# Patient Record
Sex: Male | Born: 1965 | Race: White | Hispanic: No | Marital: Married | Smoking: Never smoker
Health system: Southern US, Community
[De-identification: ages and names within clinical notes are randomized; demographics above are authoritative.]

## PROBLEM LIST (undated history)

## (undated) DIAGNOSIS — I10 Essential (primary) hypertension: Secondary | ICD-10-CM

## (undated) DIAGNOSIS — E119 Type 2 diabetes mellitus without complications: Secondary | ICD-10-CM

## (undated) DIAGNOSIS — E785 Hyperlipidemia, unspecified: Secondary | ICD-10-CM

## (undated) HISTORY — DX: Hyperlipidemia, unspecified: E78.5

## (undated) HISTORY — DX: Type 2 diabetes mellitus without complications: E11.9

## (undated) HISTORY — DX: Essential (primary) hypertension: I10

---

## 2004-05-03 ENCOUNTER — Emergency Department: Payer: Self-pay | Admitting: Emergency Medicine

## 2006-04-16 ENCOUNTER — Emergency Department: Payer: Self-pay | Admitting: Emergency Medicine

## 2006-04-16 ENCOUNTER — Emergency Department: Payer: Self-pay | Admitting: General Practice

## 2006-08-08 ENCOUNTER — Emergency Department: Payer: Self-pay | Admitting: Emergency Medicine

## 2006-11-25 ENCOUNTER — Ambulatory Visit: Payer: Self-pay | Admitting: Orthopaedic Surgery

## 2006-11-27 ENCOUNTER — Ambulatory Visit: Payer: Self-pay | Admitting: Unknown Physician Specialty

## 2009-05-01 ENCOUNTER — Emergency Department: Payer: Self-pay | Admitting: Unknown Physician Specialty

## 2009-05-02 ENCOUNTER — Emergency Department: Payer: Self-pay | Admitting: Emergency Medicine

## 2011-02-24 ENCOUNTER — Emergency Department: Payer: Self-pay | Admitting: Emergency Medicine

## 2012-01-26 LAB — BASIC METABOLIC PANEL
BUN: 13 mg/dL (ref 7–18)
Calcium, Total: 9.1 mg/dL (ref 8.5–10.1)
Chloride: 109 mmol/L — ABNORMAL HIGH (ref 98–107)
Co2: 26 mmol/L (ref 21–32)
EGFR (African American): >60
EGFR (Non-African Amer.): >60
Osmolality: 290 (ref 275–301)

## 2012-01-26 LAB — CBC
HCT: 45.4 % (ref 40.0–52.0)
HGB: 15.5 g/dL (ref 13.0–18.0)
MCH: 31.3 pg (ref 26.0–34.0)
MCHC: 34.2 g/dL (ref 32.0–36.0)
MCV: 91 fL (ref 80–100)
WBC: 9.3 10*3/uL (ref 3.8–10.6)

## 2012-01-27 ENCOUNTER — Observation Stay: Payer: Self-pay | Admitting: Internal Medicine

## 2012-01-27 LAB — CBC WITH DIFFERENTIAL/PLATELET
Basophil #: 0.1 10*3/uL (ref 0.0–0.1)
Lymphocyte #: 2.3 10*3/uL (ref 1.0–3.6)
Lymphocyte %: 28.7 %
MCHC: 34.5 g/dL (ref 32.0–36.0)
MCV: 92 fL (ref 80–100)
Monocyte %: 9.8 %
Neutrophil %: 58.6 %
Platelet: 223 10*3/uL (ref 150–440)
RBC: 4.36 10*6/uL — ABNORMAL LOW (ref 4.40–5.90)
RDW: 13.9 % (ref 11.5–14.5)
WBC: 8.2 10*3/uL (ref 3.8–10.6)

## 2012-01-27 LAB — BASIC METABOLIC PANEL
Anion Gap: 7 (ref 7–16)
Calcium, Total: 8.5 mg/dL (ref 8.5–10.1)
Co2: 29 mmol/L (ref 21–32)
EGFR (African American): >60
EGFR (Non-African Amer.): >60
Glucose: 87 mg/dL (ref 65–99)
Osmolality: 288 (ref 275–301)
Potassium: 4 mmol/L (ref 3.5–5.1)
Sodium: 145 mmol/L (ref 136–145)

## 2012-01-27 LAB — TROPONIN I
Troponin-I: <0.02 ng/mL
Troponin-I: <0.02 ng/mL

## 2012-01-27 LAB — LIPID PANEL: Ldl Cholesterol, Calc: 90 mg/dL (ref 0–100)

## 2012-01-27 LAB — CK-MB: CK-MB: 0.9 ng/mL (ref 0.5–3.6)

## 2012-01-28 ENCOUNTER — Ambulatory Visit: Payer: Self-pay | Admitting: Cardiology

## 2012-10-10 ENCOUNTER — Emergency Department: Payer: Self-pay | Admitting: Internal Medicine

## 2012-10-10 LAB — URINALYSIS, COMPLETE
Bilirubin,UR: NEGATIVE
Glucose,UR: NEGATIVE mg/dL (ref 0–75)
Ketone: NEGATIVE
Leukocyte Esterase: NEGATIVE
Nitrite: NEGATIVE
Protein: NEGATIVE
Specific Gravity: 1.017 (ref 1.003–1.030)
Squamous Epithelial: NONE SEEN
WBC UR: 4 /HPF (ref 0–5)

## 2012-10-10 LAB — CBC
MCH: 31.1 pg (ref 26.0–34.0)
MCHC: 34.9 g/dL (ref 32.0–36.0)
MCV: 89 fL (ref 80–100)
Platelet: 266 10*3/uL (ref 150–440)
RBC: 5.17 10*6/uL (ref 4.40–5.90)
RDW: 13.5 % (ref 11.5–14.5)

## 2012-10-10 LAB — COMPREHENSIVE METABOLIC PANEL
Alkaline Phosphatase: 100 U/L (ref 50–136)
Anion Gap: 6 — ABNORMAL LOW (ref 7–16)
BUN: 13 mg/dL (ref 7–18)
Bilirubin,Total: 0.8 mg/dL (ref 0.2–1.0)
Calcium, Total: 9.2 mg/dL (ref 8.5–10.1)
Chloride: 105 mmol/L (ref 98–107)
Creatinine: 1.52 mg/dL — ABNORMAL HIGH (ref 0.60–1.30)
EGFR (African American): >60
Potassium: 4 mmol/L (ref 3.5–5.1)
SGPT (ALT): 34 U/L (ref 12–78)
Sodium: 138 mmol/L (ref 136–145)

## 2012-10-11 ENCOUNTER — Emergency Department: Payer: Self-pay | Admitting: Emergency Medicine

## 2012-10-11 LAB — CBC
MCHC: 34.3 g/dL (ref 32.0–36.0)
MCV: 90 fL (ref 80–100)
Platelet: 236 10*3/uL (ref 150–440)
RBC: 4.92 10*6/uL (ref 4.40–5.90)

## 2012-10-11 LAB — URINALYSIS, COMPLETE
Bacteria: NONE SEEN
Ketone: NEGATIVE
Nitrite: NEGATIVE
Ph: 6 (ref 4.5–8.0)
RBC,UR: 1 /HPF (ref 0–5)
Specific Gravity: 1.013 (ref 1.003–1.030)
Squamous Epithelial: NONE SEEN

## 2012-10-11 LAB — COMPREHENSIVE METABOLIC PANEL
Alkaline Phosphatase: 89 U/L (ref 50–136)
BUN: 14 mg/dL (ref 7–18)
Chloride: 103 mmol/L (ref 98–107)
Co2: 28 mmol/L (ref 21–32)
Glucose: 105 mg/dL — ABNORMAL HIGH (ref 65–99)
Osmolality: 277 (ref 275–301)
Potassium: 4.4 mmol/L (ref 3.5–5.1)
SGOT(AST): 29 U/L (ref 15–37)
Sodium: 138 mmol/L (ref 136–145)

## 2012-10-13 ENCOUNTER — Ambulatory Visit: Payer: Self-pay | Admitting: Urology

## 2012-10-13 DIAGNOSIS — N200 Calculus of kidney: Secondary | ICD-10-CM | POA: Insufficient documentation

## 2014-08-15 NOTE — Consult Note (Signed)
    General Aspect patient is a [age] yol male with history of hypertension but no cardiac history he was admitted after suffering a syncopal episode.  the patient was in his usual state of health and was working when he developed midsternal chest pain with some diaphoresis. Subsequently while standing he suffered a syncopal episode. He was brought to the emergency room where he has ruled out for a myocardial infarction. EKG is normal. Laboratories were normal with the exception of possible mild volume depletion. He has had no arrhythmias. He has had no further chest pain. He has not had previous episodes similar to this in the past.   Physical Exam:   GEN well developed, well nourished, no acute distress    HEENT PERRL, hearing intact to voice    NECK supple    CARD Regular rate and rhythm  No murmur    ABD denies tenderness  normal BS    LYMPH negative neck, negative axillae    EXTR negative cyanosis/clubbing, negative edema    SKIN normal to palpation    NEURO cranial nerves intact, motor/sensory function intact    PSYCH A+O to time, place, person   Review of Systems:   Subjective/Chief Complaint chest pain and syncope    General: No Complaints    Skin: No Complaints    ENT: No Complaints    Eyes: No Complaints    Neck: No Complaints    Respiratory: No Complaints    Cardiovascular: Chest pain or discomfort  Dyspnea    Gastrointestinal: No Complaints    Genitourinary: No Complaints    Vascular: No Complaints    Musculoskeletal: No Complaints    Neurologic: Fainting    Hematologic: No Complaints    Endocrine: No Complaints    Psychiatric: No Complaints    Review of Systems: All other systems were reviewed and found to be negative    Medications/Allergies Reviewed Medications/Allergies reviewed     Hypertension:    Gout:    Kidney Stones:    Denies surgical history.:   EKG:   EKG Nml  NSR    PCN: Rash    Impression 49 year old male with  history of no prior cardiac problems with hypertension who was admitted after developing an episode of chest pain while at work complicated by lightheadedness and syncope. He is ruled out for a myocardial infarction. He has had no arrhythmias. EKG is unremarkable. Etiology of the vent appeared to have been a vagal episode with regard to his syncope the had an ischemic event as his EKG and labs are unremarkable. He appears hemodynamically stable at present. Would recommend ambulating today and if stable discharge with outpatient followup.    Plan 1. Continue with current medications 2. Low-fat, low-cholesterol, low-sodium diet 3. Hydration 4. Continue current outpatient meds 4. Would ambulate and if stable discharge with outpatient followup.   Electronic Signatures: Dalia HeadingFath, Buena Boehm A (MD)  (Signed 30-Oct-13 08:54)  Authored: General Aspect/Present Illness, History and Physical Exam, Review of System, Past Medical History, EKG , Allergies, Impression/Plan   Last Updated: 30-Oct-13 08:54 by Dalia HeadingFath, Lamari Youngers A (MD)

## 2014-08-15 NOTE — Consult Note (Signed)
Brief Consult Note: Diagnosis: chest pain with typical and atypical features. Ruled out for an mi.   Patient was seen by consultant.   Recommend further assessment or treatment.   Discussed with Attending MD.   Comments: 49 yo with history of chest pain and syncope. Ruled out for an mi. EKG normal. Echo revealed no siginificant abnormalities. Would ambulate and discharge to home. Scheduled for an outpatient myoview in am. [facility] NPO after midnight.  Electronic Signatures: Dalia HeadingFath, Chandi Nicklin A (MD)  (Signed 01-Oct-13 14:41)  Authored: Brief Consult Note   Last Updated: 01-Oct-13 14:41 by Dalia HeadingFath, Yolanda Dockendorf A (MD)

## 2014-08-15 NOTE — Discharge Summary (Signed)
PATIENT NAME:  Clifford Wong, Clifford Wong MR#:  [id number] DATE OF BIRTH:  08/03/1965  DATE OF ADMISSION:  01/27/2012 DATE OF DISCHARGE:  01/27/2012  ADMITTING PHYSICIAN: Clifford Bienenstockawood Elgergawy, MD     DISCHARGING PHYSICIAN: Clifford Baasadhika Maysen Bonsignore, MD   PRIMARY CARE PHYSICIAN: Clifford LeavensJames F. Burnett ShengHedrick, MD    HOSPITAL CONSULTATIONS:  Cardiology consultation by Dr. Lady Wong.   DISCHARGE DIAGNOSES:  1. Syncope, likely cardiogenic versus vasovagal.  2. Hypertension.  3. Gout.  DISCHARGE MEDICATIONS:  1. Aspirin 81 mg p.o. daily.  2. Lisinopril 10 mg p.o. daily.  3. Allopurinol 300 mg p.o. daily.   DISCHARGE DIET: Low sodium diet.   DISCHARGE ACTIVITY: As tolerated.    FOLLOW-UP INSTRUCTIONS:  1. Myoview scheduled for tomorrow morning to be done by Dr. Lady Wong, and the patient was advised to arrive at the registration by 8:15 a.m. He was also advised to take nothing by mouth after midnight tonight.  2. Follow up with Dr. Lady Wong as recommended after Myoview.  3. Follow up with PCP in one week.   LABORATORY, DIAGNOSTIC AND RADIOLOGICAL DATA: WBC 8.2, hemoglobin 13.9, hematocrit 40.2, platelet count 223. Sodium 145, potassium 4.0, chloride 109, bicarbonate 29, BUN 13, creatinine 1.2, glucose of 87, calcium 8.5. LDL 90, HDL 32, triglycerides 161, cholesterol 154. Troponin is less than 0.02. CT of the head showing no acute intracranial process, and chest x-ray on admission showing no acute cardiopulmonary abnormality. Carotid Dopplers showing normal without any significant stenosis, and CT of the chest was negative for any PE or any lung pathology.   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 The patient said he did not feel well after he woke up yesterday morning, has been having some sharp chest pains followed by heavy pains as if like a heavy gorilla was sitting on his chest. He went  to the bathroom, and the next thing he remembers was he was waking up on the floor. He felt lightheaded before he passed out and might have passed out for only five minutes. Then EMS brought him to the hospital. He was dehydrated with orthostatic tachycardia, elevated heart rate on standing up, but otherwise no arrhythmias or other abnormalities seen.   Syncope: Not sure if it is cardiogenic or vasovagal. He was given IV fluids, seen by cardiologist, Dr. Lady Wong. He had an echocardiogram done. Official report is pending, but per Dr. Lady Wong the ejection fraction looks fine, and he did not have any significant valvular abnormalities. The patient would benefit from getting a Myoview done. Since he wanted to go home, he is being discharged and Myoview set up for tomorrow morning. His troponins in the hospital have remained negative. He denies any chest pain now and is ambulating well without any trouble. His other home medications, lisinopril and allopurinol, were continued without any changes. He is advised to take an aspirin if he is not allergic to that or does not have significant gastrointestinal distress with that.   DISCHARGE CONDITION: Stable.   DISCHARGE DISPOSITION: Home.   TIME SPENT ON DISCHARGE: 45 minutes.  ____________________________ Clifford Baasadhika Fayetta Sorenson, MD rk:cbb D: 01/27/2012 14:56:00 ET T: 01/28/2012 11:03:54 ET JOB#: [id number]  cc: Clifford Baasadhika Coleson Kant, MD, <Dictator> Clifford LeavensJames F. Burnett ShengHedrick, MD Darlin PriestlyKenneth A. Clifford GaryFath, MD Clifford BaasADHIKA Deakin Lacek MD ELECTRONICALLY SIGNED 01/29/2012 14:58

## 2014-08-15 NOTE — H&P (Signed)
PATIENT NAME:  Clifford Wong, Clifford Wong MR#:  [id number] DATE OF BIRTH:  [birth year]  DATE OF ADMISSION:  01/27/2012  PRIMARY CARE PHYSICIAN: Dr. Jerl Mina  REFERRING PHYSICIAN: Dr. Dorothea Glassman    CHIEF COMPLAINT: Chest pain and syncope.   HISTORY OF PRESENT ILLNESS: This is a 49 year old male with significant past medical history of gout and hypertension, presents with complaint of chest pain. Patient reports initially had chest pain while at work around 7:30 p.m. Pain was sharp, piercing quality in left mid sternal area, he reports lasted for two minutes, was accompanied by shortness of breath and diaphoresis, nausea, then patient reports he went to restroom where last thing he remembers was washing his hands, then he reports remembering waking up on the floor, he reports the episode lasted for around five minutes as he called his symptoms to one of his partners on the force and they came and found him after five minutes laying on the floor. There are no urinary or stool incontinence. Patient had some mild forehead trauma where he had CT brain which did not show any acute finding. Currently patient denies any chest pain, denies any previous history of heart disease or similar complaints. Patient reports feeling lightheaded prior to this presentation. Patient was orthostatic as his heart rate went from 75 supine to 104 upon standing but blood pressure remained stable. Patient reports good intake of fluids.   PAST MEDICAL HISTORY:  1. Gout.  2. Hypertension.   PAST SURGICAL HISTORY: Vasectomy.   ALLERGIES: History of allergy to penicillin.   HOME MEDICATIONS:  1. Lisinopril 10 mg oral daily.  2. Allopurinol 300 mg oral daily.   FAMILY HISTORY: Denies any family history of coronary artery disease or diabetes.   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 Denies any smoking, alcohol, or illicit drug abuse.   REVIEW OF SYSTEMS: CONSTITUTIONAL: Patient denies any fever,  fatigue, weakness. EYES: Denies blurry vision, double vision or pain. ENT: Denies tinnitus, ear pain, hearing loss. RESPIRATORY: Denies cough, wheezing, hemoptysis. Has episode of dyspnea. CARDIOVASCULAR: Had episode of chest pain. Denies any edema, arrhythmia, palpitations. Had syncope. GASTROINTESTINAL: Complains of nausea. Denies any diarrhea, abdominal pain, hematemesis, jaundice. GENITOURINARY: Denies dysuria, hematuria, renal colic. ENDO: Denies polyuria, nocturia, heat or cold intolerance. HEMATOLOGY: Denies anemia, easy bruising, bleeding diathesis. MUSCULOSKELETAL: Denies neck pain, shoulder pain, knee pain, arthritis, cramps or swelling. Has history of gout. NEUROLOGICAL: Denies numbness, dysarthria, epilepsy, tremors, vertigo, ataxia. Had lightheadedness. PSYCHIATRIC: Denies anxiety, insomnia, schizophrenia, nervousness, bipolar disorder.   PHYSICAL EXAMINATION:  VITAL SIGNS: Temperature 98.4, pulse 75, respiratory rate 20, blood pressure 131/67, saturating 97% on room air, pulse upon laying supine 74, pulse upon standing 104.   GENERAL: Well-nourished young male, looks comfortable in bed in no apparent distress.   HEENT: Head atraumatic, normocephalic. Pupils equal, reactive to light. Pink conjunctivae. Anicteric sclerae. Moist oral mucosa.   NECK: Supple. No thyromegaly. No JVD.   CHEST: Good air entry bilaterally. No wheezing, rales, rhonchi.   CARDIOVASCULAR: S1, S2 heard. No rubs, murmur, gallops. No carotid bruits.  ABDOMEN: Soft, nontender, nondistended. Bowel sounds present.   EXTREMITIES: No edema, no clubbing, no cyanosis.    NEUROLOGICAL: Cranial nerves grossly intact. Motor 5/5. Sensation symmetrical bilaterally.   PSYCHIATRIC: Appropriate affect. Awake, alert x3. Intact judgment and insight.    LABORATORY, DIAGNOSTIC AND RADIOLOGICAL DATA:  Glucose 113, BUN 13, creatinine 1.18.  Sodium 145, potassium 3.8, chloride 109, CO2 26, anion gap 10. Troponin less  than 0.02.  White blood cells 9.3, hemoglobin 15.5, hematocrit 45.4, platelets 270.   EKG showing sinus tachycardia with ventricular rate of 111. No significant ST or T wave changes.   ASSESSMENT AND PLAN:  1. Chest pain, currently resolved. Patient had dyspnea, nausea, diaphoresis with the chest pain. First set of cardiac enzymes is negative. No significant EKG changes. Given 324 of aspirin, will continue on aspirin. Will check lipid panel. Will check 2-D echo and will consult cardiology service in a.m. for further management of chest pain.  2. Syncope. Patient was mildly tachycardic upon standing. Will continue with fluid hydration, received 1 liter bolus in ED, will continue 100 mL/h of normal saline. Will check bilateral carotid Doppler and MRI of the brain. Will admit to telemetry and will check 2-D echo.  3. Hypertension. Given the fact patient was tachycardic upon standing will hold his lisinopril, continue with fluids and will resume when patient is more stable.  4. Gout. Will continue with allopurinol. Currently has no complaints.  5. Deep vein thrombosis prophylaxis. Sub-Q heparin.  6. CODE STATUS: FULL CODE.   TOTAL TIME SPENT ON ADMISSION AND PATIENT CARE: 55 minutes.   ____________________________ Starleen Armsawood S. Pocahontas Cohenour, MD dse:cms D: 01/27/2012 01:04:44 ET T: 01/27/2012 08:04:16 ET JOB#: [id number]  cc: Starleen Armsawood S. Jaymarie Yeakel, MD, <Dictator> Rhona LeavensJames F. Burnett ShengHedrick, MD Paschal Blanton Teena IraniS Dez Stauffer MD ELECTRONICALLY SIGNED 01/28/2012 1:46

## 2016-11-13 ENCOUNTER — Encounter (INDEPENDENT_AMBULATORY_CARE_PROVIDER_SITE_OTHER): Payer: Self-pay

## 2016-11-13 ENCOUNTER — Ambulatory Visit: Payer: Self-pay | Admitting: Physician Assistant

## 2016-11-13 VITALS — BP 129/80 | HR 93 | Temp 98.5°F | Resp 16

## 2016-11-13 DIAGNOSIS — R631 Polydipsia: Secondary | ICD-10-CM

## 2016-11-13 DIAGNOSIS — R358 Other polyuria: Secondary | ICD-10-CM

## 2016-11-13 DIAGNOSIS — M109 Gout, unspecified: Secondary | ICD-10-CM | POA: Insufficient documentation

## 2016-11-13 DIAGNOSIS — R5383 Other fatigue: Secondary | ICD-10-CM

## 2016-11-13 DIAGNOSIS — R3589 Other polyuria: Secondary | ICD-10-CM

## 2016-11-13 NOTE — Progress Notes (Addendum)
S: pt states he is concerned that he has diabetes.  States he has been really thirsty and having to urinate a lot.  Goes at least 10-12 times a day, is really tired, no cp/sob, no v/d, no weight gain or weight loss, no fam hx of diabetes  O: vitals wnl, nad, tms clear, throat wnl, neck supple no lymph, lungs c t a, cv rrr, ekg wnl, ua 2+ glucose  A: polydipsia, polyuria, most likely diabetes, fatigue  P: labs today, will f/u with pt when receive results Lab results show a1c 11.0, triglycerides 868, creatinine of 1.3,  And vit d of 12. rma deacon called pt to discuss, will send glucometer to pt , sent in rx for metformin, vit d, and tricor, lancets, test strips, pt to be referred to endocrinolgy, advise he at least take the prediabetes class for $35 to help with diet, can refer to lifestyle for full diabetes class if he prefers

## 2016-11-14 ENCOUNTER — Encounter: Payer: Self-pay | Admitting: Physician Assistant

## 2016-11-14 LAB — CMP12+LP+TP+TSH+6AC+PSA+CBC…
ALK PHOS: 99 IU/L (ref 39–117)
ALT: 56 IU/L — ABNORMAL HIGH (ref 0–44)
AST: 33 IU/L (ref 0–40)
Albumin/Globulin Ratio: 2.4 — ABNORMAL HIGH (ref 1.2–2.2)
Albumin: 4.7 g/dL (ref 3.5–5.5)
BASOS ABS: 0.1 10*3/uL (ref 0.0–0.2)
BILIRUBIN TOTAL: 0.3 mg/dL (ref 0.0–1.2)
BUN / CREAT RATIO: 15 (ref 9–20)
BUN: 20 mg/dL (ref 6–24)
Basos: 1 %
CHLORIDE: 94 mmol/L — AB (ref 96–106)
CHOL/HDL RATIO: 8.4 ratio — AB (ref 0.0–5.0)
CHOLESTEROL TOTAL: 243 mg/dL — AB (ref 100–199)
Calcium: 9.6 mg/dL (ref 8.7–10.2)
Creatinine, Ser: 1.3 mg/dL — ABNORMAL HIGH (ref 0.76–1.27)
EOS (ABSOLUTE): 0.3 10*3/uL (ref 0.0–0.4)
EOS: 3 %
ESTIMATED CHD RISK: 1.7 times avg. — AB (ref 0.0–1.0)
FREE THYROXINE INDEX: 2.4 (ref 1.2–4.9)
GFR calc non Af Amer: 63 mL/min/{1.73_m2} (ref >59)
GFR, EST AFRICAN AMERICAN: 73 mL/min/{1.73_m2} (ref >59)
GGT: 31 IU/L (ref 0–65)
GLUCOSE: 372 mg/dL — AB (ref 65–99)
Globulin, Total: 2 g/dL (ref 1.5–4.5)
HDL: 29 mg/dL — ABNORMAL LOW (ref >39)
HEMOGLOBIN: 15.7 g/dL (ref 13.0–17.7)
Hematocrit: 46.5 % (ref 37.5–51.0)
IMMATURE GRANS (ABS): 0 10*3/uL (ref 0.0–0.1)
IMMATURE GRANULOCYTES: 0 %
Iron: 82 ug/dL (ref 38–169)
LDH: 205 IU/L (ref 121–224)
Lymphocytes Absolute: 2.1 10*3/uL (ref 0.7–3.1)
Lymphs: 29 %
MCH: 30.8 pg (ref 26.6–33.0)
MCHC: 33.8 g/dL (ref 31.5–35.7)
MCV: 91 fL (ref 79–97)
MONOCYTES: 7 %
Monocytes Absolute: 0.5 10*3/uL (ref 0.1–0.9)
NEUTROS PCT: 60 %
Neutrophils Absolute: 4.4 10*3/uL (ref 1.4–7.0)
PLATELETS: 256 10*3/uL (ref 150–379)
PROSTATE SPECIFIC AG, SERUM: 0.3 ng/mL (ref 0.0–4.0)
Phosphorus: 4.9 mg/dL — ABNORMAL HIGH (ref 2.5–4.5)
Potassium: 4.4 mmol/L (ref 3.5–5.2)
RBC: 5.09 x10E6/uL (ref 4.14–5.80)
RDW: 13.9 % (ref 12.3–15.4)
Sodium: 136 mmol/L (ref 134–144)
T3 Uptake Ratio: 29 % (ref 24–39)
T4, Total: 8.2 ug/dL (ref 4.5–12.0)
TSH: 2.04 u[IU]/mL (ref 0.450–4.500)
Total Protein: 6.7 g/dL (ref 6.0–8.5)
Triglycerides: 868 mg/dL (ref 0–149)
Uric Acid: 5.3 mg/dL (ref 3.7–8.6)
WBC: 7.3 10*3/uL (ref 3.4–10.8)

## 2016-11-14 LAB — POCT URINALYSIS DIPSTICK
Bilirubin, UA: NEGATIVE
Blood, UA: NEGATIVE
Ketones, UA: NEGATIVE
LEUKOCYTES UA: NEGATIVE
Nitrite, UA: NEGATIVE
PROTEIN UA: NEGATIVE
SPEC GRAV UA: 1.01 (ref 1.010–1.025)
UROBILINOGEN UA: 0.2 U/dL
pH, UA: 6 (ref 5.0–8.0)

## 2016-11-14 LAB — B12 AND FOLATE PANEL
Folate: 16.9 ng/mL (ref >3.0)
Vitamin B-12: 474 pg/mL (ref 232–1245)

## 2016-11-14 LAB — HGB A1C W/O EAG: Hgb A1c MFr Bld: 11.6 % — ABNORMAL HIGH (ref 4.8–5.6)

## 2016-11-14 LAB — VITAMIN D 25 HYDROXY (VIT D DEFICIENCY, FRACTURES): Vit D, 25-Hydroxy: 12 ng/mL — ABNORMAL LOW (ref 30.0–100.0)

## 2016-11-14 MED ORDER — GLUCOSE BLOOD VI STRP: ORAL_STRIP | 12 refills | Status: DC

## 2016-11-14 MED ORDER — METFORMIN HCL 1000 MG PO TABS: 1000.0000 mg | ORAL_TABLET | Freq: Two times a day (BID) | ORAL | 3 refills | Status: DC

## 2016-11-14 MED ORDER — ONETOUCH ULTRASOFT LANCETS MISC: 12 refills | Status: DC

## 2016-11-14 MED ORDER — VITAMIN D (ERGOCALCIFEROL) 1.25 MG (50000 UNIT) PO CAPS: 50000.0000 [IU] | ORAL_CAPSULE | ORAL | 3 refills | Status: DC

## 2016-11-14 MED ORDER — FENOFIBRATE 160 MG PO TABS: 160.0000 mg | ORAL_TABLET | Freq: Every day | ORAL | 6 refills | Status: DC

## 2016-11-14 MED ORDER — METFORMIN HCL 500 MG PO TABS: 500.0000 mg | ORAL_TABLET | Freq: Two times a day (BID) | ORAL | 0 refills | Status: DC

## 2016-11-14 NOTE — Addendum Note (Signed)
Addended by: Catha BrowEACON, Tamsin Nader T on: 11/14/2016 10:43 AM   Modules accepted: Orders

## 2016-11-14 NOTE — Addendum Note (Signed)
Addended by: Faythe GheeFISHER, SUSAN W on: 11/14/2016 04:18 PM   Modules accepted: Orders

## 2016-11-16 LAB — MICROALBUMIN / CREATININE URINE RATIO
CREATININE, UR: 45.6 mg/dL
Microalb/Creat Ratio: <6.6 mg/g creat (ref 0.0–30.0)
Microalbumin, Urine: <3 ug/mL

## 2016-11-17 NOTE — Progress Notes (Signed)
[facility].

## 2016-11-17 NOTE — Telephone Encounter (Signed)
Please see message attached

## 2016-11-19 ENCOUNTER — Encounter: Payer: Self-pay | Admitting: Physician Assistant

## 2016-11-20 NOTE — Telephone Encounter (Signed)
See message attached

## 2017-01-23 ENCOUNTER — Other Ambulatory Visit: Payer: Self-pay | Admitting: Physician Assistant

## 2017-01-23 NOTE — Telephone Encounter (Signed)
Med refill for lisinopril approved 

## 2017-02-14 ENCOUNTER — Encounter: Payer: Self-pay | Admitting: Physician Assistant

## 2017-02-20 ENCOUNTER — Ambulatory Visit: Payer: Self-pay | Admitting: Physician Assistant

## 2017-02-20 DIAGNOSIS — Z299 Encounter for prophylactic measures, unspecified: Secondary | ICD-10-CM

## 2017-02-20 NOTE — Progress Notes (Signed)
Patient came in to have blood drawn for testing for an upcoming appointment with Dr. Tedd [facility]iasSolum at [facility].

## 2017-02-21 LAB — CMP12+LP+TP+TSH+6AC+CBC/D/PLT
ALT: 29 IU/L (ref 0–44)
AST: 25 IU/L (ref 0–40)
Albumin/Globulin Ratio: 2.5 — ABNORMAL HIGH (ref 1.2–2.2)
Albumin: 5 g/dL (ref 3.5–5.5)
Alkaline Phosphatase: 49 IU/L (ref 39–117)
BASOS ABS: 0.1 10*3/uL (ref 0.0–0.2)
BUN / CREAT RATIO: 18 (ref 9–20)
BUN: 22 mg/dL (ref 6–24)
Basos: 1 %
Bilirubin Total: 0.4 mg/dL (ref 0.0–1.2)
CALCIUM: 9.7 mg/dL (ref 8.7–10.2)
CHOL/HDL RATIO: 3.9 ratio (ref 0.0–5.0)
CREATININE: 1.25 mg/dL (ref 0.76–1.27)
Chloride: 105 mmol/L (ref 96–106)
Cholesterol, Total: 155 mg/dL (ref 100–199)
EOS (ABSOLUTE): 0.4 10*3/uL (ref 0.0–0.4)
ESTIMATED CHD RISK: 0.7 times avg. (ref 0.0–1.0)
Eos: 7 %
Free Thyroxine Index: 2.1 (ref 1.2–4.9)
GFR calc Af Amer: 77 mL/min/{1.73_m2} (ref >59)
GFR, EST NON AFRICAN AMERICAN: 66 mL/min/{1.73_m2} (ref >59)
GGT: 16 IU/L (ref 0–65)
GLOBULIN, TOTAL: 2 g/dL (ref 1.5–4.5)
GLUCOSE: 102 mg/dL — AB (ref 65–99)
HDL: 40 mg/dL (ref >39)
Hematocrit: 45.7 % (ref 37.5–51.0)
Hemoglobin: 15.3 g/dL (ref 13.0–17.7)
Immature Grans (Abs): 0 10*3/uL (ref 0.0–0.1)
Immature Granulocytes: 0 %
Iron: 101 ug/dL (ref 38–169)
LDH: 150 IU/L (ref 121–224)
LDL Calculated: 85 mg/dL (ref 0–99)
LYMPHS ABS: 1.9 10*3/uL (ref 0.7–3.1)
Lymphs: 33 %
MCH: 30.8 pg (ref 26.6–33.0)
MCHC: 33.5 g/dL (ref 31.5–35.7)
MCV: 92 fL (ref 79–97)
Monocytes Absolute: 0.5 10*3/uL (ref 0.1–0.9)
Monocytes: 8 %
NEUTROS ABS: 3 10*3/uL (ref 1.4–7.0)
Neutrophils: 51 %
PHOSPHORUS: 3.2 mg/dL (ref 2.5–4.5)
Platelets: 314 10*3/uL (ref 150–379)
Potassium: 5.2 mmol/L (ref 3.5–5.2)
RBC: 4.97 x10E6/uL (ref 4.14–5.80)
RDW: 14.5 % (ref 12.3–15.4)
Sodium: 142 mmol/L (ref 134–144)
T3 Uptake Ratio: 24 % (ref 24–39)
T4 TOTAL: 8.7 ug/dL (ref 4.5–12.0)
TSH: 1.88 u[IU]/mL (ref 0.450–4.500)
Total Protein: 7 g/dL (ref 6.0–8.5)
Triglycerides: 148 mg/dL (ref 0–149)
URIC ACID: 7.3 mg/dL (ref 3.7–8.6)
VLDL Cholesterol Cal: 30 mg/dL (ref 5–40)
WBC: 5.7 10*3/uL (ref 3.4–10.8)

## 2017-02-21 LAB — HGB A1C W/O EAG: HEMOGLOBIN A1C: 5.8 % — AB (ref 4.8–5.6)

## 2017-06-03 ENCOUNTER — Ambulatory Visit: Payer: Self-pay | Admitting: Emergency Medicine

## 2017-06-03 DIAGNOSIS — Z299 Encounter for prophylactic measures, unspecified: Secondary | ICD-10-CM

## 2017-06-03 NOTE — Progress Notes (Unsigned)
Patient came in to have blood drawn for testing per Dr. Solum's orders. 

## 2017-06-04 LAB — HGB A1C W/O EAG: HEMOGLOBIN A1C: 5.5 % (ref 4.8–5.6)

## 2017-06-04 LAB — COMPREHENSIVE METABOLIC PANEL
A/G RATIO: 2.3 — AB (ref 1.2–2.2)
ALBUMIN: 4.8 g/dL (ref 3.5–5.5)
ALK PHOS: 75 IU/L (ref 39–117)
ALT: 16 IU/L (ref 0–44)
AST: 19 IU/L (ref 0–40)
BILIRUBIN TOTAL: 0.6 mg/dL (ref 0.0–1.2)
BUN / CREAT RATIO: 10 (ref 9–20)
BUN: 12 mg/dL (ref 6–24)
CHLORIDE: 104 mmol/L (ref 96–106)
CO2: 25 mmol/L (ref 20–29)
Calcium: 9.8 mg/dL (ref 8.7–10.2)
Creatinine, Ser: 1.15 mg/dL (ref 0.76–1.27)
GFR calc non Af Amer: 73 mL/min/{1.73_m2} (ref >59)
GFR, EST AFRICAN AMERICAN: 84 mL/min/{1.73_m2} (ref >59)
Globulin, Total: 2.1 g/dL (ref 1.5–4.5)
Glucose: 113 mg/dL — ABNORMAL HIGH (ref 65–99)
POTASSIUM: 4.8 mmol/L (ref 3.5–5.2)
Sodium: 144 mmol/L (ref 134–144)
TOTAL PROTEIN: 6.9 g/dL (ref 6.0–8.5)

## 2017-10-19 ENCOUNTER — Telehealth: Payer: Self-pay

## 2017-10-19 NOTE — Telephone Encounter (Signed)
Monitoring/refills are the responsibility of the patient's primary care provider.  Please notify the pharmacy.

## 2017-10-19 NOTE — Telephone Encounter (Signed)
Pharmacy notified.

## 2017-10-19 NOTE — Telephone Encounter (Signed)
Received fax requested refill for Vitamin D.  Last Vit D lab 11/13/2016. Last O/V with provider 11/13/2016  Other "O/V" were lab appointments for other Dr. orders.

## 2017-12-11 ENCOUNTER — Other Ambulatory Visit: Payer: Self-pay

## 2017-12-11 DIAGNOSIS — Z794 Long term (current) use of insulin: Principal | ICD-10-CM

## 2017-12-11 DIAGNOSIS — E119 Type 2 diabetes mellitus without complications: Secondary | ICD-10-CM

## 2017-12-12 LAB — LIPID PANEL WITH LDL/HDL RATIO
Cholesterol, Total: 143 mg/dL (ref 100–199)
HDL: 38 mg/dL — AB (ref >39)
LDL CALC: 85 mg/dL (ref 0–99)
LDL/HDL RATIO: 2.2 ratio (ref 0.0–3.6)
TRIGLYCERIDES: 98 mg/dL (ref 0–149)
VLDL Cholesterol Cal: 20 mg/dL (ref 5–40)

## 2017-12-12 LAB — MICROALBUMIN / CREATININE URINE RATIO
Creatinine, Urine: 82.9 mg/dL
Microalb/Creat Ratio: <3.6 mg/g creat (ref 0.0–30.0)
Microalbumin, Urine: <3 ug/mL

## 2017-12-12 LAB — BASIC METABOLIC PANEL
BUN / CREAT RATIO: 17 (ref 9–20)
BUN: 22 mg/dL (ref 6–24)
CHLORIDE: 104 mmol/L (ref 96–106)
CO2: 23 mmol/L (ref 20–29)
CREATININE: 1.3 mg/dL — AB (ref 0.76–1.27)
Calcium: 9.7 mg/dL (ref 8.7–10.2)
GFR calc Af Amer: 73 mL/min/{1.73_m2} (ref >59)
GFR calc non Af Amer: 63 mL/min/{1.73_m2} (ref >59)
GLUCOSE: 100 mg/dL — AB (ref 65–99)
POTASSIUM: 5.6 mmol/L — AB (ref 3.5–5.2)
SODIUM: 142 mmol/L (ref 134–144)

## 2017-12-12 LAB — HGB A1C W/O EAG: HEMOGLOBIN A1C: 5.6 % (ref 4.8–5.6)

## 2018-07-05 ENCOUNTER — Telehealth: Payer: Self-pay

## 2018-07-05 NOTE — Telephone Encounter (Signed)
Patient called the clinic wanting to know if we were able to refill his potassium citrate prescription. He was informed that we could not at the Acute care level refill that medication, that it would need to be done through a PCP. The patient does not currently have a PCP and was informed that the clinic has a list on hand that he could come by to pick up at his convenience  The patient gave verbal understanding.

## 2018-08-18 ENCOUNTER — Encounter: Payer: Self-pay | Admitting: Family Medicine

## 2018-08-18 ENCOUNTER — Telehealth: Payer: Self-pay

## 2018-08-18 ENCOUNTER — Ambulatory Visit (INDEPENDENT_AMBULATORY_CARE_PROVIDER_SITE_OTHER): Payer: Self-pay | Admitting: Family Medicine

## 2018-08-18 ENCOUNTER — Ambulatory Visit: Payer: Self-pay | Admitting: Family Medicine

## 2018-08-18 DIAGNOSIS — N200 Calculus of kidney: Secondary | ICD-10-CM

## 2018-08-18 DIAGNOSIS — E119 Type 2 diabetes mellitus without complications: Secondary | ICD-10-CM

## 2018-08-18 DIAGNOSIS — M1A9XX0 Chronic gout, unspecified, without tophus (tophi): Secondary | ICD-10-CM

## 2018-08-18 DIAGNOSIS — Z13220 Encounter for screening for lipoid disorders: Secondary | ICD-10-CM

## 2018-08-18 MED ORDER — ALLOPURINOL 300 MG PO TABS: 300.0000 mg | ORAL_TABLET | Freq: Every day | ORAL | 1 refills | Status: DC

## 2018-08-18 MED ORDER — POTASSIUM CITRATE ER 15 MEQ (1620 MG) PO TBCR: 1.0000 | EXTENDED_RELEASE_TABLET | Freq: Every day | ORAL | 1 refills | Status: DC

## 2018-08-18 NOTE — Telephone Encounter (Signed)
Patient called the clinic needing a refill on his allopurinol and his Potassium but has not had recent labs or obtained a PCP. I was able to get him an appointment with a local PCP for today and they will be contacting him for his visit and medication maintenance. The patient gave verbal understanding.

## 2018-08-18 NOTE — Progress Notes (Signed)
Patient ID: Clifford CunasJeffrey H Crossman, male   DOB: 11/13/1965, 53 y.o.   MRN: [id number]  Virtual Visit via video Note  This visit type was conducted due to national recommendations for restrictions regarding the COVID-19 pandemic (e.g. social distancing).  This format is felt to be most appropriate for this patient at this time.  All issues noted in this document were discussed and addressed.  No physical exam was performed (except for noted visual exam findings with Video Visits).   I connected with Gladis RiffleJeffrey Babington on 08/18/18 at 11:20 AM EDT by a video enabled telemedicine application or telephone and verified that I am speaking with the correct person using two identifiers. Location patient: home Location provider: [facility]  Persons participating in the virtual visit: patient, provider  I discussed the limitations, risks, security and privacy concerns of performing an evaluation and management service by video and the availability of in person appointments. I also discussed with the patient that there may be a patient responsible charge related to this service. The patient expressed understanding and agreed to proceed.  HPI: Patient and I connected via video for him to establish with PCP.  [facility], but was told he must have a PCP for further refills of medications.  He has a history of gout and kidney stones for which he takes allopurinol and potassium supplement.  He has not had lab work since August 2019.  He also previously had been on metformin to treat type 2 diabetes, lisinopril for high blood pressure and fenofibrate for cholesterol.  He no longer takes any of those medications.   Review of Systems  Constitutional: Negative for chills, fatigue and fever.  HENT: Negative for congestion, ear pain, sinus pain and sore throat.   Eyes: Negative.   Respiratory: Negative for cough, shortness of breath and wheezing.   Cardiovascular: Negative for  chest pain, palpitations and leg swelling.  Gastrointestinal: Negative for abdominal pain, diarrhea, nausea and vomiting.  Genitourinary: Negative for dysuria, frequency and urgency.  Musculoskeletal: Negative for arthralgias and myalgias.  Skin: Negative for color change, pallor and rash.  Neurological: Negative for syncope, light-headedness and headaches.  Psychiatric/Behavioral: The patient is not nervous/anxious.     Past Medical History:  Diagnosis Date  . Hyperlipidemia   . Hypertension   . Type 2 diabetes mellitus (HCC)    History reviewed. No pertinent surgical history.  Family History  Problem Relation Age of Onset  . Gout Mother   . Gout Father     Social History   Tobacco Use  . Smoking status: Never Smoker  . Smokeless tobacco: Never Used  Substance Use Topics  . Alcohol use: Not Currently    Current Outpatient Medications:  .  allopurinol (ZYLOPRIM) 300 MG tablet, Take 1 tablet (300 mg total) by mouth daily., Disp: 30 tablet, Rfl: 1 .  Potassium Citrate 15 MEQ (1620 MG) TBCR, Take 1 tablet by mouth daily., Disp: 30 tablet, Rfl: 1  EXAM:  GENERAL: alert, oriented, appears well and in no acute distress  HEENT: atraumatic, conjunttiva clear, no obvious abnormalities on inspection of external nose and ears  NECK: normal movements of the head and neck  LUNGS: on inspection no signs of respiratory distress, breathing rate appears normal, no obvious gross SOB, gasping or wheezing  CV: no obvious cyanosis  MS: moves all visible extremities without noticeable abnormality  PSYCH/NEURO: pleasant and cooperative, no obvious depression or anxiety, speech and thought processing grossly intact  ASSESSMENT  AND PLAN:  Discussed the following assessment and plan:  Chronic gout without tophus, unspecified cause, unspecified site - Plan: CBC w/Diff, Basic Metabolic Panel (BMET), Hepatic function panel, allopurinol (ZYLOPRIM) 300 MG tablet  Lipid screening - Plan:  Lipid panel  Type 2 diabetes mellitus without complication, without long-term current use of insulin (HCC) - Plan: HgB A1c, TSH  Nephrolithiasis - Plan: Potassium Citrate 15 MEQ (1620 MG) TBCR   Patient advised I will give him 30-day supply medication with 1 refill and will extend the refills once I have his lab work back.  Advised patient that due to the medications he takes it is important that we check his blood work at least twice per year to follow-up on electrolytes and kidney functions.  Also due to his past history of diabetes, cholesterol is important that we continue to be on the look out for any changes in his cholesterol panel, his sugar levels and assess if he needs to be put back on diabetes/cholesterol medications.  Patient has never had colonoscopy, and would like to hold off on any referrals or doing Cologuard at this time.  States he will think about doing the Cologuard and let us know.   I discussed the assessment and treatment plan with the patient. The patient was provided an opportunity to ask questions and all were answered. The patient agreed with the plan and demonstrated an understanding of the instructions.   Patient advised he will need to come to the office at some point for in person evaluation, we can get that scheduled after we have blood work results back and once the coronavirus social distancing recommendations have been reduced.  Tracey Harries, FNP

## 2018-08-19 ENCOUNTER — Ambulatory Visit: Payer: Self-pay | Admitting: Family Medicine

## 2018-08-30 ENCOUNTER — Other Ambulatory Visit: Payer: Managed Care, Other (non HMO)

## 2018-08-30 DIAGNOSIS — E119 Type 2 diabetes mellitus without complications: Secondary | ICD-10-CM

## 2018-08-30 DIAGNOSIS — Z13220 Encounter for screening for lipoid disorders: Secondary | ICD-10-CM | POA: Diagnosis not present

## 2018-08-30 DIAGNOSIS — M1A9XX0 Chronic gout, unspecified, without tophus (tophi): Secondary | ICD-10-CM

## 2018-08-30 NOTE — Addendum Note (Signed)
Addended by: Karen Kays on: 08/30/2018 09:11 AM   Modules accepted: Orders

## 2018-08-31 LAB — CBC WITH DIFFERENTIAL/PLATELET
Basophils Absolute: 0.1 10*3/uL (ref 0.0–0.2)
Basos: 1 %
EOS (ABSOLUTE): 0.2 10*3/uL (ref 0.0–0.4)
Eos: 4 %
Hematocrit: 44.5 % (ref 37.5–51.0)
Hemoglobin: 15.4 g/dL (ref 13.0–17.7)
Immature Grans (Abs): 0 10*3/uL (ref 0.0–0.1)
Immature Granulocytes: 0 %
Lymphocytes Absolute: 1.8 10*3/uL (ref 0.7–3.1)
Lymphs: 33 %
MCH: 31.6 pg (ref 26.6–33.0)
MCHC: 34.6 g/dL (ref 31.5–35.7)
MCV: 91 fL (ref 79–97)
Monocytes Absolute: 0.5 10*3/uL (ref 0.1–0.9)
Monocytes: 9 %
Neutrophils Absolute: 2.9 10*3/uL (ref 1.4–7.0)
Neutrophils: 53 %
Platelets: 240 10*3/uL (ref 150–450)
RBC: 4.88 x10E6/uL (ref 4.14–5.80)
RDW: 13.1 % (ref 11.6–15.4)
WBC: 5.4 10*3/uL (ref 3.4–10.8)

## 2018-08-31 LAB — BASIC METABOLIC PANEL
BUN/Creatinine Ratio: 17 (ref 9–20)
BUN: 19 mg/dL (ref 6–24)
CO2: 25 mmol/L (ref 20–29)
Calcium: 9.4 mg/dL (ref 8.7–10.2)
Chloride: 103 mmol/L (ref 96–106)
Creatinine, Ser: 1.14 mg/dL (ref 0.76–1.27)
GFR calc Af Amer: 84 mL/min/{1.73_m2} (ref >59)
GFR calc non Af Amer: 73 mL/min/{1.73_m2} (ref >59)
Glucose: 102 mg/dL — ABNORMAL HIGH (ref 65–99)
Potassium: 4.5 mmol/L (ref 3.5–5.2)
Sodium: 144 mmol/L (ref 134–144)

## 2018-08-31 LAB — LIPID PANEL WITH LDL/HDL RATIO
Cholesterol, Total: 134 mg/dL (ref 100–199)
HDL: 34 mg/dL — ABNORMAL LOW (ref >39)
LDL Calculated: 70 mg/dL (ref 0–99)
LDl/HDL Ratio: 2.1 ratio (ref 0.0–3.6)
Triglycerides: 148 mg/dL (ref 0–149)
VLDL Cholesterol Cal: 30 mg/dL (ref 5–40)

## 2018-08-31 LAB — TSH: TSH: 2.93 u[IU]/mL (ref 0.450–4.500)

## 2018-08-31 LAB — HEPATIC FUNCTION PANEL
ALT: 12 IU/L (ref 0–44)
AST: 12 IU/L (ref 0–40)
Albumin: 4.5 g/dL (ref 3.8–4.9)
Alkaline Phosphatase: 75 IU/L (ref 39–117)
Bilirubin Total: 0.6 mg/dL (ref 0.0–1.2)
Bilirubin, Direct: 0.16 mg/dL (ref 0.00–0.40)
Total Protein: 6.3 g/dL (ref 6.0–8.5)

## 2018-08-31 LAB — HGB A1C W/O EAG: Hgb A1c MFr Bld: 5.3 % (ref 4.8–5.6)

## 2018-11-24 ENCOUNTER — Other Ambulatory Visit: Payer: Self-pay | Admitting: Family Medicine

## 2018-11-24 DIAGNOSIS — M1A9XX0 Chronic gout, unspecified, without tophus (tophi): Secondary | ICD-10-CM

## 2018-11-24 DIAGNOSIS — N200 Calculus of kidney: Secondary | ICD-10-CM

## 2021-03-26 ENCOUNTER — Ambulatory Visit (INDEPENDENT_AMBULATORY_CARE_PROVIDER_SITE_OTHER): Payer: Managed Care, Other (non HMO) | Admitting: Urology

## 2021-03-26 ENCOUNTER — Encounter: Payer: Self-pay | Admitting: Urology

## 2021-03-26 ENCOUNTER — Other Ambulatory Visit: Payer: Self-pay

## 2021-03-26 VITALS — BP 158/83 | HR 98 | Ht 73.0 in | Wt 243.0 lb

## 2021-03-26 DIAGNOSIS — R635 Abnormal weight gain: Secondary | ICD-10-CM | POA: Diagnosis not present

## 2021-03-26 DIAGNOSIS — N200 Calculus of kidney: Secondary | ICD-10-CM

## 2021-03-26 NOTE — Progress Notes (Signed)
   03/26/21 1:09 PM   Clifford Wong 1965/05/02 [id number]  CC: Concern for possible low testosterone, nephrolithiasis  HPI: I saw Clifford Wong today for evaluation of possible low testosterone.  He also has a history of recurrent uric acid nephrolithiasis previously followed by Dr. Achilles Dunk, but denies any recent stone events.  He reportedly is on allopurinol and potassium citrate through his PCP.  He reports difficulty with weight loss, especially in his midsection, and is wondering if this could be secondary to low testosterone.  He has a number of labs and paperwork with him today which I reviewed, and testosterone was normal at 486 in April 2021, and 445 in May 2022.  He denies significant problems with erections.  He also reports some decreased energy and fatigue.   PMH: Past Medical History:  Diagnosis Date   Hyperlipidemia    Hypertension    Type 2 diabetes mellitus (HCC)      Family History: Family History  Problem Relation Age of Onset   Gout Mother    Gout Father     Social History:  reports that he has never smoked. He has never used smokeless tobacco. He reports that he does not currently use alcohol. He reports that he does not use drugs.  Physical Exam: BP (!) 158/83   Pulse 98   Ht 6\' 1"  (1.854 m)   Wt 243 lb (110.2 kg)   BMI 32.06 kg/m    Constitutional:  Alert and oriented, No acute distress. Cardiovascular: No clubbing, cyanosis, or edema. Respiratory: Normal respiratory effort, no increased work of breathing. GI: Abdomen is soft, nontender, nondistended, no abdominal masses   Laboratory Data: Reviewed, see HPI  Assessment & Plan:   55 year old male with recurrent uric acid nephrolithiasis on allopurinol and potassium citrate, as well as concern for difficulty with weight loss and fatigue.  His testosterone levels are normal x2, and we reviewed the AUA guidelines that do not recommend testosterone replacement in patients with a normal testosterone  level.  Options would be to repeat a morning testosterone in the future, and he will get this done through his work, versus observation.  We reviewed stone prevention strategies.  He can follow-up with urology as needed   58, MD 03/26/2021  [facility] [address] [place], [place] [phone]

## 2021-03-26 NOTE — Patient Instructions (Signed)
Testosterone Replacement Therapy Testosterone replacement therapy (TRT) treats men who have a low testosterone level. Testosterone is a male hormone that is produced in the testicles. It is responsible for typical male characteristics and for maintaining a man's sex drive and ability to get an erection. Testosterone also supports bone and muscle health. Low testosterone may not need to be treated. Your health care provider may recommend TRT if you have symptoms such as a low sex drive or erection problems, weak muscles or bones, or low energy. Types of TRT You and your health care provider will decide which form is best for you. TRT is available in the following forms: Topical gels, creams, lotions, or sprays. Do not let other people, especially women or children, come in contact with the skin where the testosterone was applied. Nasal gels. Patches. Pills. Injections into the muscle or under the skin. Long-acting pellets inserted under the skin. The amount of TRT you take and how long you take it is based on your condition. It is important to: Begin TRT with the lowest possible dosage. Stop TRT if your health care provider tells you to stop. Work with your health care provider so that you feel informed and comfortable with your decision. Tell a health care provider about: Any allergies you have. Any personal or family history of blood clots, breast cancer, or prostate cancer. If you have sleep apnea or have been told that you snore. All medicines you are taking, including vitamins, herbs, eye drops, creams, and over-the-counter medicines. Any surgeries you have had. Any other medical conditions you have. If you wish to have biological children someday. What are the benefits? Benefits of TRT vary but may include improved sexual function, muscle mass or strength, mood, or quality of life. What are the risks? Risks of TRT vary depending on your individual and medical history. Side effects can  be related to the type of TRT you choose. If you choose products that are used on the skin, you may have skin irritation. If you get injections, you may have redness and swelling at the injection site. Side effects that can happen with any form of TRT include: Lower sperm count. Acne. Swelling of your legs or feet, or tenderness in the chest or breast area. Sleep disturbances and mood swings. Enlarged prostate. Increase in your red blood count. It is unclear if testosterone increases the risk of some serious conditions. Talk with your health care provider about your risk for these conditions, including: Blood clots. Heart disease, such as stroke or heart attack. Prostate cancer. Risks of TRT may increase if you: Have had or are at risk for prostate cancer or breast cancer. Have had a previous stroke or heart attack. Have a high number of red blood cells. Have treated or untreated sleep apnea. Have a large prostate. The long-term safety of TRT is not known. Follow these instructions at home: Take over-the-counter and prescription medicines only as told by your health care provider. Lose weight if you are overweight. Ask your health care provider to help you start a healthy diet and exercise program to reach and maintain a healthy weight. Work with your health care provider to manage other medical conditions that may lower your testosterone. These include obesity, high blood pressure, high cholesterol, diabetes, liver disease, kidney disease, and sleep apnea. Do not use any testosterone replacement therapies that are not prescribed by your health care provider. Keep all follow-up visits. This is important. Where to find more information [place]:  www.urologyhealth.org Endocrine Society: www.hormone.org Contact a health care provider if: You have side effects from your TRT. You have pain or swelling in your legs. You have problems urinating. You have lumps or  changes in your breasts or armpits. Get help right away if: You have shortness of breath. You have chest pain. You have slurred speech. You have weakness or numbness in any part of your arms or legs. These symptoms may represent a serious problem that is an emergency. Do not wait to see if the symptoms will go away. Get medical help right away. Call your local emergency services (911 in the [place] Do not drive yourself to the hospital. Summary Testosterone replacement therapy (TRT) is used to treat men who have a low testosterone level. TRT should only be prescribed by and under the supervision of a health care provider. Tell a health care provider about any medical conditions you have. TRT may have side effects. Talk with your health care provider about all of the risks and benefits before you start therapy. This information is not intended to replace advice given to you by your health care provider. Make sure you discuss any questions you have with your health care provider. Document Revised: 02/07/2020 Document Reviewed: 02/07/2020 Elsevier Patient Education  2022 Else[employer] Inc.  Dietary Guidelines to Help Prevent Kidney Stones Kidney stones are deposits of minerals and salts that form inside your kidneys. Your risk of developing kidney stones may be greater depending on your diet, your lifestyle, the medicines you take, and whether you have certain medical conditions. Most people can lower their chances of developing kidney stones by following the instructions below. Your dietitian may give you more specific instructions depending on your overall health and the type of kidney stones you tend to develop. What are tips for following this plan? Reading food labels  Choose foods with "no salt added" or "low-salt" labels. Limit your salt (sodium) intake to less than 1,500 mg a day. Choose foods with calcium for each meal and snack. Try to eat about 300 mg of calcium at each meal. Foods that  contain 200-500 mg of calcium a serving include: 8 oz (237 mL) of milk, calcium-fortifiednon-dairy milk, and calcium-fortifiedfruit juice. Calcium-fortified means that calcium has been added to these drinks. 8 oz (237 mL) of kefir, yogurt, and soy yogurt. 4 oz (114 g) of tofu. 1 oz (28 g) of cheese. 1 cup (150 g) of dried figs. 1 cup (91 g) of cooked broccoli. One 3 oz (85 g) can of sardines or mackerel. Most people need 1,000-1,500 mg of calcium a day. Talk to your dietitian about how much calcium is recommended for you. Shopping Buy plenty of fresh fruits and vegetables. Most people do not need to avoid fruits and vegetables, even if these foods contain nutrients that may contribute to kidney stones. When shopping for convenience foods, choose: Whole pieces of fruit. Pre-made salads with dressing on the side. Low-fat fruit and yogurt smoothies. Avoid buying frozen meals or prepared deli foods. These can be high in sodium. Look for foods with live cultures, such as yogurt and kefir. Choose high-fiber grains, such as whole-wheat breads, oat bran, and wheat cereals. Cooking Do not add salt to food when cooking. Place a salt shaker on the table and allow each person to add his or her own salt to taste. Use vegetable protein, such as beans, textured vegetable protein (TVP), or tofu, instead of meat in pasta, casseroles, and soups. Meal planning Eat less salt, if  told by your dietitian. To do this: Avoid eating processed or pre-made food. Avoid eating fast food. Eat less animal protein, including cheese, meat, poultry, or fish, if told by your dietitian. To do this: Limit the number of times you have meat, poultry, fish, or cheese each week. Eat a diet free of meat at least 2 days a week. Eat only one serving each day of meat, poultry, fish, or seafood. When you prepare animal protein, cut pieces into small portion sizes. For most meat and fish, one serving is about the size of the palm of  your hand. Eat at least five servings of fresh fruits and vegetables each day. To do this: Keep fruits and vegetables on hand for snacks. Eat one piece of fruit or a handful of berries with breakfast. Have a salad and fruit at lunch. Have two kinds of vegetables at dinner. Limit foods that are high in a substance called oxalate. These include: Spinach (cooked), rhubarb, beets, sweet potatoes, and Swiss chard. Peanuts. Potato chips, french fries, and baked potatoes with skin on. Nuts and nut products. Chocolate. If you regularly take a diuretic medicine, make sure to eat at least 1 or 2 servings of fruits or vegetables that are high in potassium each day. These include: Avocado. Banana. Orange, prune, carrot, or tomato juice. Baked potato. Cabbage. Beans and split peas. Lifestyle  Drink enough fluid to keep your urine pale yellow. This is the most important thing you can do. Spread your fluid intake throughout the day. If you drink alcohol: Limit how much you use to: 0-1 drink a day for women who are not pregnant. 0-2 drinks a day for men. Be aware of how much alcohol is in your drink. In the [place] one drink equals one 12 oz bottle of beer (355 mL), one 5 oz glass of wine (148 mL), or one 1 oz glass of hard liquor (44 mL). Lose weight if told by your health care provider. Work with your dietitian to find an eating plan and weight loss strategies that work best for you. General information Talk to your health care provider and dietitian about taking daily supplements. You may be told the following depending on your health and the cause of your kidney stones: Not to take supplements with vitamin C. To take a calcium supplement. To take a daily probiotic supplement. To take other supplements such as magnesium, fish oil, or vitamin B6. Take over-the-counter and prescription medicines only as told by your health care provider. These include supplements. What foods should I limit? Limit  your intake of the following foods, or eat them as told by your dietitian. Vegetables Spinach. Rhubarb. Beets. Canned vegetables. Rosita Fire. Olives. Baked potatoes with skin. Grains Wheat bran. Baked goods. Salted crackers. Cereals high in sugar. Meats and other proteins Nuts. Nut butters. Large portions of meat, poultry, or fish. Salted, precooked, or cured meats, such as sausages, meat loaves, and hot dogs. Dairy Cheese. Beverages Regular soft drinks. Regular vegetable juice. Seasonings and condiments Seasoning blends with salt. Salad dressings. Soy sauce. Ketchup. Barbecue sauce. Other foods Canned soups. Canned pasta sauce. Casseroles. Pizza. Lasagna. Frozen meals. Potato chips. [place] fries. The items listed above may not be a complete list of foods and beverages you should limit. Contact a dietitian for more information. What foods should I avoid? Talk to your dietitian about specific foods you should avoid based on the type of kidney stones you have and your overall health. Fruits Grapefruit. The item listed above may  not be a complete list of foods and beverages you should avoid. Contact a dietitian for more information. Summary Kidney stones are deposits of minerals and salts that form inside your kidneys. You can lower your risk of kidney stones by making changes to your diet. The most important thing you can do is drink enough fluid. Drink enough fluid to keep your urine pale yellow. Talk to your dietitian about how much calcium you should have each day, and eat less salt and animal protein as told by your dietitian. This information is not intended to replace advice given to you by your health care provider. Make sure you discuss any questions you have with your health care provider. Document Revised: 04/07/2019 Document Reviewed: 04/07/2019 Elsevier Patient Education  2022 [employer].

## 2021-07-08 ENCOUNTER — Ambulatory Visit
Admission: RE | Admit: 2021-07-08 | Discharge: 2021-07-08 | Disposition: A | Discharge status: Home / Self Care | Payer: Managed Care, Other (non HMO) | Attending: Urology | Admitting: Urology

## 2021-07-08 ENCOUNTER — Encounter: Payer: Self-pay | Admitting: Urology

## 2021-07-08 ENCOUNTER — Ambulatory Visit
Admission: RE | Admit: 2021-07-08 | Discharge: 2021-07-08 | Disposition: A | Discharge status: Home / Self Care | Payer: Managed Care, Other (non HMO) | Source: Ambulatory Visit | Attending: Urology | Admitting: Urology

## 2021-07-08 ENCOUNTER — Ambulatory Visit (INDEPENDENT_AMBULATORY_CARE_PROVIDER_SITE_OTHER): Payer: Managed Care, Other (non HMO) | Admitting: Urology

## 2021-07-08 ENCOUNTER — Other Ambulatory Visit: Payer: Self-pay

## 2021-07-08 ENCOUNTER — Other Ambulatory Visit
Admission: RE | Admit: 2021-07-08 | Discharge: 2021-07-08 | Disposition: A | Discharge status: Home / Self Care | Payer: Managed Care, Other (non HMO) | Source: Home / Self Care | Attending: Urology | Admitting: Urology

## 2021-07-08 ENCOUNTER — Other Ambulatory Visit: Payer: Self-pay | Admitting: *Deleted

## 2021-07-08 VITALS — BP 148/90 | HR 101 | Ht 73.0 in | Wt 228.0 lb

## 2021-07-08 DIAGNOSIS — R110 Nausea: Secondary | ICD-10-CM

## 2021-07-08 DIAGNOSIS — N179 Acute kidney failure, unspecified: Secondary | ICD-10-CM

## 2021-07-08 DIAGNOSIS — N200 Calculus of kidney: Secondary | ICD-10-CM

## 2021-07-08 DIAGNOSIS — R109 Unspecified abdominal pain: Secondary | ICD-10-CM

## 2021-07-08 DIAGNOSIS — N201 Calculus of ureter: Secondary | ICD-10-CM | POA: Diagnosis not present

## 2021-07-08 DIAGNOSIS — D72829 Elevated white blood cell count, unspecified: Secondary | ICD-10-CM

## 2021-07-08 LAB — URINALYSIS, COMPLETE (UACMP) WITH MICROSCOPIC
Glucose, UA: NEGATIVE mg/dL
Ketones, ur: 15 mg/dL — AB
Leukocytes,Ua: NEGATIVE
Nitrite: NEGATIVE
Protein, ur: NEGATIVE mg/dL
Specific Gravity, Urine: 1.02 (ref 1.005–1.030)
pH: 6 (ref 5.0–8.0)

## 2021-07-08 LAB — BASIC METABOLIC PANEL
Anion gap: 10 (ref 5–15)
BUN: 17 mg/dL (ref 6–20)
CO2: 28 mmol/L (ref 22–32)
Calcium: 8.8 mg/dL — ABNORMAL LOW (ref 8.9–10.3)
Chloride: 96 mmol/L — ABNORMAL LOW (ref 98–111)
Creatinine, Ser: 2.05 mg/dL — ABNORMAL HIGH (ref 0.61–1.24)
GFR, Estimated: 37 mL/min — ABNORMAL LOW (ref >60)
Glucose, Bld: 141 mg/dL — ABNORMAL HIGH (ref 70–99)
Potassium: 3.9 mmol/L (ref 3.5–5.1)
Sodium: 134 mmol/L — ABNORMAL LOW (ref 135–145)

## 2021-07-08 LAB — CBC WITH DIFFERENTIAL/PLATELET
Abs Immature Granulocytes: 0.04 10*3/uL (ref 0.00–0.07)
Basophils Absolute: 0 10*3/uL (ref 0.0–0.1)
Basophils Relative: 0 %
Eosinophils Absolute: 0 10*3/uL (ref 0.0–0.5)
Eosinophils Relative: 0 %
HCT: 45.5 % (ref 39.0–52.0)
Hemoglobin: 15.9 g/dL (ref 13.0–17.0)
Immature Granulocytes: 0 %
Lymphocytes Relative: 9 %
Lymphs Abs: 1.3 10*3/uL (ref 0.7–4.0)
MCH: 31.5 pg (ref 26.0–34.0)
MCHC: 34.9 g/dL (ref 30.0–36.0)
MCV: 90.1 fL (ref 80.0–100.0)
Monocytes Absolute: 1.4 10*3/uL — ABNORMAL HIGH (ref 0.1–1.0)
Monocytes Relative: 10 %
Neutro Abs: 11.7 10*3/uL — ABNORMAL HIGH (ref 1.7–7.7)
Neutrophils Relative %: 81 %
Platelets: 220 10*3/uL (ref 150–400)
RBC: 5.05 MIL/uL (ref 4.22–5.81)
RDW: 14.3 % (ref 11.5–15.5)
WBC: 14.4 10*3/uL — ABNORMAL HIGH (ref 4.0–10.5)
nRBC: 0 % (ref 0.0–0.2)

## 2021-07-08 LAB — BLADDER SCAN AMB NON-IMAGING

## 2021-07-08 MED ORDER — SULFAMETHOXAZOLE-TRIMETHOPRIM 800-160 MG PO TABS: 1.0000 | ORAL_TABLET | Freq: Two times a day (BID) | ORAL | 0 refills | Status: DC

## 2021-07-08 MED ORDER — ONDANSETRON HCL 4 MG PO TABS: 4.0000 mg | ORAL_TABLET | Freq: Three times a day (TID) | ORAL | 0 refills | Status: DC | PRN

## 2021-07-08 MED ORDER — TAMSULOSIN HCL 0.4 MG PO CAPS: 0.4000 mg | ORAL_CAPSULE | Freq: Every day | ORAL | 3 refills | Status: DC

## 2021-07-08 NOTE — Progress Notes (Signed)
07/08/2021 12:48 PM   Clifford Wong 1966/04/04 [id number]  Referring provider: Jodelle Green, FNP No address on file  Chief Complaint  Patient presents with   Nephrolithiasis   Urological history: 1. Nephrolithiasis -stone composition of uric acid  -managed with potassium citrate 15 mEq QD and allopurinol 300 mg QD  2. ED -contributing factors of age, HTN, DM and  HLD -managed with sildenafil 20 mg, on-demand-dosing  HPI: Clifford Wong is a 56 y.o. who presents today for an urgent appointment for a possible kidney stones and only urinating three times since Saturday.  He states he noted Thursday night he started experiencing lower back pain which she attributed to muscle skeletal issues.  He had no symptoms on Friday, but Saturday at 1 AM he had very sharp right lower back pain radiating to the right groin area.  He also had chills, nausea and vomiting on Saturday.  He had old Zofran on hand and took that which controlled the nausea and vomiting.  He has not had any fevers since Saturday.  He also mentions that he has not had any flatus or bowel movement since Friday.  He denied any belching.  He also had an expired prescription of tamsulosin and started taking that as well.  Patient denies any modifying or aggravating factors.  Patient denies any gross hematuria, dysuria or suprapubic/flank pain.  Patient denies any fevers, chills, nausea or vomiting.    UA positive for 6-10 RBCs, rare bacteria, granular and RBC casts are present.   Serum creatinine up at 2.05 from 1.14 a year ago.  WBC count of 14.4.  KUB radiopaque calcification in the right lower quadrant, but with patient's history of uric acid stones and significant stool and gas within the x-rays difficult to evaluate for any further nephrolithiasis.  STAT CT Renal stone study  .    PMH: Past Medical History:  Diagnosis Date   Hyperlipidemia    Hypertension    Type 2 diabetes mellitus ([place])      Surgical History: No past surgical history on file.  Home Medications:  Allergies as of 07/08/2021       Reactions   Penicillin G Other (See Comments)        Medication List        Accurate as of July 08, 2021 12:48 PM. If you have any questions, ask your nurse or doctor.          allopurinol 300 MG tablet Commonly known as: ZYLOPRIM TAKE ONE TABLET BY MOUTH EVERY DAY   ondansetron 4 MG tablet Commonly known as: Zofran Take 1 tablet (4 mg total) by mouth every 8 (eight) hours as needed for nausea or vomiting. Started by: Zara Council, PA-C   Potassium Citrate 15 MEQ (1620 MG) Tbcr TAKE ONE TABLET BY MOUTH EVERY DAY   sulfamethoxazole-trimethoprim 800-160 MG tablet Commonly known as: BACTRIM DS Take 1 tablet by mouth every 12 (twelve) hours. Started by: Zara Council, PA-C   tamsulosin 0.4 MG Caps capsule Commonly known as: FLOMAX Take 1 capsule (0.4 mg total) by mouth daily. Started by: Zara Council, PA-C        Allergies:  Allergies  Allergen Reactions   Penicillin G Other (See Comments)    Family History: Family History  Problem Relation Age of Onset   Gout Mother    Gout Father     Social History:  reports that he has never smoked. He has never been exposed to tobacco smoke.  He has never used smokeless tobacco. He reports that he does not currently use alcohol. He reports that he does not use drugs.  ROS: Pertinent ROS in HPI  Physical Exam: BP (!) 148/90    Pulse (!) 101    Ht '6\' 1"'  (1.854 m)    Wt 228 lb (103.4 kg)    BMI 30.08 kg/m   Constitutional:  Well nourished. Alert and oriented, No acute distress. HEENT: [place] AT, mask in place  Trachea midline Cardiovascular: No clubbing, cyanosis, or edema. Respiratory: Normal respiratory effort, no increased work of breathing. Neurologic: Grossly intact, no focal deficits, moving all 4 extremities. Psychiatric: Normal mood and affect.  Laboratory Data: CBC Latest Ref Rng & Units  07/08/2021 08/30/2018 02/20/2017  WBC 4.0 - 10.5 K/uL 14.4(H) 5.4 5.7  Hemoglobin 13.0 - 17.0 g/dL 15.9 15.4 15.3  Hematocrit 39.0 - 52.0 % 45.5 44.5 45.7  Platelets 150 - 400 K/uL 220 240 314    BMP Latest Ref Rng & Units 07/08/2021 08/30/2018 12/11/2017  Glucose 70 - 99 mg/dL 141(H) 102(H) 100(H)  BUN 6 - 20 mg/dL '17 19 22  ' Creatinine 0.61 - 1.24 mg/dL 2.05(H) 1.14 1.30(H)  BUN/Creat Ratio 9 - 20 - 17 17  Sodium 135 - 145 mmol/L 134(L) 144 142  Potassium 3.5 - 5.1 mmol/L 3.9 4.5 5.6(H)  Chloride 98 - 111 mmol/L 96(L) 103 104  CO2 22 - 32 mmol/L '28 25 23  ' Calcium 8.9 - 10.3 mg/dL 8.8(L) 9.4 9.7    Urinalysis Component     Latest Ref Rng & Units 07/08/2021  Color, Urine     YELLOW YELLOW  Appearance     CLEAR CLEAR  Specific Gravity, Urine     1.005 - 1.030 1.020  pH     5.0 - 8.0 6.0  Glucose, UA     NEGATIVE mg/dL NEGATIVE  Hgb urine dipstick     NEGATIVE TRACE (A)  Bilirubin Urine     NEGATIVE SMALL (A)  Ketones, ur     NEGATIVE mg/dL 15 (A)  Protein     NEGATIVE mg/dL NEGATIVE  Nitrite     NEGATIVE NEGATIVE  Leukocytes,Ua     NEGATIVE NEGATIVE  Squamous Epithelial / LPF     0 - 5 0-5  WBC, UA     0 - 5 WBC/hpf 0-5  RBC / HPF     0 - 5 RBC/hpf 6-10  Bacteria, UA     NONE SEEN RARE (A)  Mucus      PRESENT  Hyaline Casts, UA      PRESENT  Granular Casts, UA      PRESENT  RBC Casts, UA      PRESENT  I have reviewed the labs.   Pertinent Imaging: CLINICAL DATA:  Flank pain.  Kidney stones.   EXAM: CT ABDOMEN AND PELVIS WITHOUT CONTRAST   TECHNIQUE: Multidetector CT imaging of the abdomen and pelvis was performed following the standard protocol without IV contrast.   RADIATION DOSE REDUCTION: This exam was performed according to the departmental dose-optimization program which includes automated exposure control, adjustment of the mA and/or kV according to patient size and/or use of iterative reconstruction technique.   COMPARISON:  10/10/2012    FINDINGS: Lower chest: Unremarkable.   Hepatobiliary: No suspicious focal abnormality in the liver on this study without intravenous contrast. There is no evidence for gallstones, gallbladder wall thickening, or pericholecystic fluid. No intrahepatic or extrahepatic biliary dilation.   Pancreas: No focal mass lesion.  No dilatation of the main duct. No intraparenchymal cyst. No peripancreatic edema.   Spleen: No splenomegaly. No focal mass lesion.   Adrenals/Urinary Tract: No adrenal nodule or mass.   Similar bilateral renal stone burden although individual stones appear slightly larger in the interval. Approximately 12 stones are seen in the right kidney with approximately 15 stones in the left kidney. 9 x 7 mm anterior left upper pole stone seen on 36/2 measured 7 x 4 mm previously. Index stone posterior lower pole right kidney measuring 10 x 7 mm today (45/2) was 5 x 3 mm previously.   Mild right hydroureteronephrosis is secondary to two adjacent stones in the distal right ureter just proximal to the UVJ. The more proximal of the 2 stones measures approximately 3 x 3 x 3 mm. The more distal of the 2 stones measures 6 x 5 x 6 mm.   No left ureteral stones.   No bladder stones.   Stomach/Bowel: Stomach is decompressed. Duodenum is normally positioned as is the ligament of Treitz. No small bowel wall thickening. No small bowel dilatation. The terminal ileum is normal. The appendix is not well visualized, but there is no edema or inflammation in the region of the cecum. No gross colonic mass. No colonic wall thickening.   Vascular/Lymphatic: There is mild atherosclerotic calcification of the abdominal aorta without aneurysm. There is no gastrohepatic or hepatoduodenal ligament lymphadenopathy. No retroperitoneal or mesenteric lymphadenopathy. No pelvic sidewall lymphadenopathy.   Reproductive: The prostate gland and seminal vesicles are unremarkable.   Other: No  intraperitoneal free fluid.   Musculoskeletal: No worrisome lytic or sclerotic osseous abnormality.   IMPRESSION: 1. Mild right hydroureteronephrosis secondary to two adjacent stones in the distal right ureter just proximal to the UVJ. 2. Similar number of bilateral renal stones although individual stones appear slightly larger in the interval. 3. Aortic Atherosclerosis (ICD10-I70.0).     Electronically Signed   By: Misty Stanley M.D.   On: 07/08/2021 11:34 I have independently reviewed the films.    Assessment & Plan:    1. Right UVJ stones -given script for tamsulosin 0.4 mg daily -given script for ondansetron 4 mg daily  -Reviewed case with Dr. Diamantina Providence after stat CT scan and blood work available and offered MET vs ureteroscopy for definitive stone management-advised the patient there is a 60% chance of passing the right UVJ stones -Mr. Weinmann would like to pursue MET at this time and appropriate prescriptions are sent to his pharmacy -I reviewed the red flag signs with him, such as fever, chills, oliguria, nausea, vomiting, increasing pain and/or blood in the urine and instructed him to contact the office immediately or seek care in the emergency room for the symptoms-he voices understanding and agreed  2.  Nephrolithiasis -Large stone burden bilaterally -We will have a follow-up KUB next week if he spontaneously passes the right UVJ stones to discuss management of his bilateral renal stones -If he fails MET, the stones may be addressed during a ureteroscopic procedure  3. AKI -serum creatinine 2.05 -increase hydration  4. Leukocytosis -WBC count 14.4  -Septra DS, BID x 7 days sent to pharmacy -Urine culture is pending   Return in about 1 week (around 07/15/2021) for KUB and office visit .  These notes generated with voice recognition software. I apologize for typographical errors.  Zara Council, PA-C  [facility] [address]   [place], [place] [address] [phone]

## 2021-07-09 ENCOUNTER — Other Ambulatory Visit: Payer: Self-pay | Admitting: Urology

## 2021-07-09 DIAGNOSIS — N201 Calculus of ureter: Secondary | ICD-10-CM

## 2021-07-09 DIAGNOSIS — K5909 Other constipation: Secondary | ICD-10-CM

## 2021-07-09 LAB — URINE CULTURE: Culture: NO GROWTH

## 2021-07-09 MED ORDER — OXYCODONE HCL 5 MG PO TABS: 5.0000 mg | ORAL_TABLET | ORAL | 0 refills | Status: DC | PRN

## 2021-07-09 MED ORDER — DULCOLAX 5 MG PO TBEC: 5.0000 mg | DELAYED_RELEASE_TABLET | Freq: Every day | ORAL | 0 refills | Status: AC | PRN

## 2021-07-16 ENCOUNTER — Other Ambulatory Visit: Payer: Self-pay

## 2021-07-16 ENCOUNTER — Ambulatory Visit
Admission: RE | Admit: 2021-07-16 | Discharge: 2021-07-16 | Disposition: A | Discharge status: Home / Self Care | Payer: Managed Care, Other (non HMO) | Attending: Urology | Admitting: Urology

## 2021-07-16 ENCOUNTER — Encounter: Payer: Self-pay | Admitting: Urology

## 2021-07-16 ENCOUNTER — Ambulatory Visit
Admission: RE | Admit: 2021-07-16 | Discharge: 2021-07-16 | Disposition: A | Discharge status: Home / Self Care | Payer: Managed Care, Other (non HMO) | Source: Ambulatory Visit | Attending: Urology | Admitting: Urology

## 2021-07-16 ENCOUNTER — Ambulatory Visit (INDEPENDENT_AMBULATORY_CARE_PROVIDER_SITE_OTHER): Payer: Managed Care, Other (non HMO) | Admitting: Urology

## 2021-07-16 VITALS — BP 131/79 | HR 84 | Ht 73.0 in | Wt 237.0 lb

## 2021-07-16 DIAGNOSIS — N200 Calculus of kidney: Secondary | ICD-10-CM | POA: Diagnosis not present

## 2021-07-16 NOTE — Patient Instructions (Addendum)
[facility] Instructions °[facility] °  °You will receive a box/kit in the mail that will have a urine jug and instructions in the kit.  When the box arrives you will need to call our office [phone] to schedule a LAB appointment. °  °You will need to do a 24hour urine and this should be done during the days that our office will be open.  For example any day from Sunday through Thursday. °  °If you take Vitamin C 100mg or greater please stop this 5 days prior to collection. °  °How to collect the urine sample: On the day you start the urine sample this 1st morning urine should NOT be collected.  For the rest of the day including all night urines should be collected.  On the next morning the 1st urine should be collected and then you will be finished with the urine collections. °  °You will need to bring the box with you on your LAB appointment day after urine has been collected and all instructions are complete in the box.  Your blood will be drawn and the box will be collected by our Lab employee to be sent off for analysis. °  °When urine and blood is complete you will need to schedule a follow up appointment for lab results.  ° ° ° ° ° °Dietary Guidelines to Help Prevent Kidney Stones °Kidney stones are deposits of minerals and salts that form inside your kidneys. Your risk of developing kidney stones may be greater depending on your diet, your lifestyle, the medicines you take, and whether you have certain medical conditions. Most people can lower their chances of developing kidney stones by following the instructions below. Your dietitian may give you more specific instructions depending on your overall health and the type of kidney stones you tend to develop. °What are tips for following this plan? °Reading food labels ° °Choose foods with "no salt added" or "low-salt" labels. Limit your salt (sodium) intake to less than 1,500 mg a day. °Choose foods with calcium for each meal and  snack. Try to eat about 300 mg of calcium at each meal. Foods that contain 200-500 mg of calcium a serving include: °8 oz (237 mL) of milk, calcium-fortifiednon-dairy milk, and calcium-fortifiedfruit juice. Calcium-fortified means that calcium has been added to these drinks. °8 oz (237 mL) of kefir, yogurt, and soy yogurt. °4 oz (114 g) of tofu. °1 oz (28 g) of cheese. °1 cup (150 g) of dried figs. °1 cup (91 g) of cooked broccoli. °One 3 oz (85 g) can of sardines or mackerel. °Most people need 1,000-1,500 mg of calcium a day. Talk to your dietitian about how much calcium is recommended for you. °Shopping °Buy plenty of fresh fruits and vegetables. Most people do not need to avoid fruits and vegetables, even if these foods contain nutrients that may contribute to kidney stones. °When shopping for convenience foods, choose: °Whole pieces of fruit. °Pre-made salads with dressing on the side. °Low-fat fruit and yogurt smoothies. °Avoid buying frozen meals or prepared deli foods. These can be high in sodium. °Look for foods with live cultures, such as yogurt and kefir. °Choose high-fiber grains, such as whole-wheat breads, oat bran, and wheat cereals. °Cooking °Do not add salt to food when cooking. Place a salt shaker on the table and allow each person to add his or her own salt to taste. °Use vegetable protein, such as beans, textured vegetable protein (TVP), or tofu, instead of meat   in pasta, casseroles, and soups. °Meal planning °Eat less salt, if told by your dietitian. To do this: °Avoid eating processed or pre-made food. °Avoid eating fast food. °Eat less animal protein, including cheese, meat, poultry, or fish, if told by your dietitian. To do this: °Limit the number of times you have meat, poultry, fish, or cheese each week. Eat a diet free of meat at least 2 days a week. °Eat only one serving each day of meat, poultry, fish, or seafood. °When you prepare animal protein, cut pieces into small portion sizes. For  most meat and fish, one serving is about the size of the palm of your hand. °Eat at least five servings of fresh fruits and vegetables each day. To do this: °Keep fruits and vegetables on hand for snacks. °Eat one piece of fruit or a handful of berries with breakfast. °Have a salad and fruit at lunch. °Have two kinds of vegetables at dinner. °Limit foods that are high in a substance called oxalate. These include: °Spinach (cooked), rhubarb, beets, sweet potatoes, and Swiss chard. °Peanuts. °Potato chips, french fries, and baked potatoes with skin on. °Nuts and nut products. °Chocolate. °If you regularly take a diuretic medicine, make sure to eat at least 1 or 2 servings of fruits or vegetables that are high in potassium each day. These include: °Avocado. °Banana. °Orange, prune, carrot, or tomato juice. °Baked potato. °Cabbage. °Beans and split peas. °Lifestyle ° °Drink enough fluid to keep your urine pale yellow. This is the most important thing you can do. Spread your fluid intake throughout the day. °If you drink alcohol: °Limit how much you use to: °0-1 drink a day for women who are not pregnant. °0-2 drinks a day for men. °Be aware of how much alcohol is in your drink. In the [place] one drink equals one 12 oz bottle of beer (355 mL), one 5 oz glass of wine (148 mL), or one 1½ oz glass of hard liquor (44 mL). °Lose weight if told by your health care provider. Work with your dietitian to find an eating plan and weight loss strategies that work best for you. °General information °Talk to your health care provider and dietitian about taking daily supplements. You may be told the following depending on your health and the cause of your kidney stones: °Not to take supplements with vitamin C. °To take a calcium supplement. °To take a daily probiotic supplement. °To take other supplements such as magnesium, fish oil, or vitamin B6. °Take over-the-counter and prescription medicines only as told by your health care  provider. These include supplements. °What foods should I limit? °Limit your intake of the following foods, or eat them as told by your dietitian. °Vegetables °Spinach. Rhubarb. Beets. Canned vegetables. Pickles. Olives. Baked potatoes with skin. °Grains °Wheat bran. Baked goods. Salted crackers. Cereals high in sugar. °Meats and other proteins °Nuts. Nut butters. Large portions of meat, poultry, or fish. Salted, precooked, or cured meats, such as sausages, meat loaves, and hot dogs. °Dairy °Cheese. °Beverages °Regular soft drinks. Regular vegetable juice. °Seasonings and condiments °Seasoning blends with salt. Salad dressings. Soy sauce. Ketchup. Barbecue sauce. °Other foods °Canned soups. Canned pasta sauce. Casseroles. Pizza. Lasagna. Frozen meals. Potato chips. French fries. °The items listed above may not be a complete list of foods and beverages you should limit. Contact a dietitian for more information. °What foods should I avoid? °Talk to your dietitian about specific foods you should avoid based on the type of kidney stones you have   and your overall health. °Fruits °Grapefruit. °The item listed above may not be a complete list of foods and beverages you should avoid. Contact a dietitian for more information. °Summary °Kidney stones are deposits of minerals and salts that form inside your kidneys. °You can lower your risk of kidney stones by making changes to your diet. °The most important thing you can do is drink enough fluid. Drink enough fluid to keep your urine pale yellow. °Talk to your dietitian about how much calcium you should have each day, and eat less salt and animal protein as told by your dietitian. °This information is not intended to replace advice given to you by your health care provider. Make sure you discuss any questions you have with your health care provider. °Document Revised: 04/07/2019 Document Reviewed: 04/07/2019 °Elsevier Patient Education © 2022 Elsevier Inc. ° °

## 2021-07-16 NOTE — Progress Notes (Signed)
? ?  07/16/2021 ?10:04 AM  ? ?Clifford Wong ?05-May-1965 ?[id number] ? ?Reason for visit: Nephrolithiasis ? ?HPI: ?I saw Clifford Wong for follow-up of right ureteral stones.  He was seen by our PA Zara Council on 07/08/2021 for right-sided flank pain, and CT showed two 5 mm right distal ureteral stones with upstream hydronephrosis, as well as extensive bilateral nonobstructing renal stones.  He opted for trial of medical expulsive therapy, and within the next few days he passed his stones with complete relief of his flank pain.  Urine culture was negative. ? ?Stone type has reportedly been uric acid previously.  I personally viewed and interpreted his KUB today that shows clearance of prior right distal ureteral stones, and extensive nonobstructing stones bilaterally that are clearly seen on x-ray.  This would indicate stone type may not be uric acid as previously thought. ? ?He takes potassium citrate and allopurinol daily for his history of stone disease. ? ?He eats primarily spinach salads to help with weight loss, and we reviewed the high oxalate content of spinach that is likely contributing to his recurrent stone events. We discussed general stone prevention strategies including adequate hydration with goal of producing 2.5 L of urine daily, increasing citric acid intake, increasing calcium intake during high oxalate meals, minimizing animal protein, and decreasing salt intake. Information about dietary recommendations given today.  ? ?-Stones sent for analysis ?-Behavioral strategies discussed at length regarding stone disease, and to cut back on spinach intake ?-Follow-up for 24-hour urinalysis ? ? ?Billey Co, MD ? ?[facility] ?[address], [address] ?[place], [place] [address] ?[phone] ? ? ?

## 2021-07-19 LAB — CALCULI, WITH PHOTOGRAPH (CLINICAL LAB)
Calcium Oxalate Monohydrate: 100 %
Weight Calculi: 238 mg

## 2021-08-20 ENCOUNTER — Other Ambulatory Visit: Payer: Self-pay | Admitting: Urology

## 2021-09-03 ENCOUNTER — Other Ambulatory Visit: Payer: Self-pay | Admitting: Urology

## 2021-09-03 ENCOUNTER — Telehealth: Payer: Self-pay

## 2021-09-03 ENCOUNTER — Other Ambulatory Visit
Admission: RE | Admit: 2021-09-03 | Discharge: 2021-09-03 | Disposition: A | Discharge status: Home / Self Care | Payer: Managed Care, Other (non HMO) | Source: Home / Self Care | Attending: Urology | Admitting: Urology

## 2021-09-03 ENCOUNTER — Ambulatory Visit
Admission: RE | Admit: 2021-09-03 | Discharge: 2021-09-03 | Disposition: A | Discharge status: Home / Self Care | Payer: Managed Care, Other (non HMO) | Source: Ambulatory Visit | Attending: Urology | Admitting: Urology

## 2021-09-03 ENCOUNTER — Other Ambulatory Visit: Payer: Self-pay | Admitting: *Deleted

## 2021-09-03 ENCOUNTER — Encounter: Payer: Self-pay | Admitting: Urology

## 2021-09-03 ENCOUNTER — Ambulatory Visit
Admission: RE | Admit: 2021-09-03 | Discharge: 2021-09-03 | Disposition: A | Discharge status: Home / Self Care | Payer: Managed Care, Other (non HMO) | Attending: Urology | Admitting: Urology

## 2021-09-03 ENCOUNTER — Ambulatory Visit (INDEPENDENT_AMBULATORY_CARE_PROVIDER_SITE_OTHER): Payer: Managed Care, Other (non HMO) | Admitting: Urology

## 2021-09-03 VITALS — BP 163/89 | HR 92 | Ht 73.0 in | Wt 234.0 lb

## 2021-09-03 DIAGNOSIS — N200 Calculus of kidney: Secondary | ICD-10-CM | POA: Insufficient documentation

## 2021-09-03 DIAGNOSIS — M1A9XX0 Chronic gout, unspecified, without tophus (tophi): Secondary | ICD-10-CM

## 2021-09-03 DIAGNOSIS — N201 Calculus of ureter: Secondary | ICD-10-CM | POA: Diagnosis not present

## 2021-09-03 DIAGNOSIS — N179 Acute kidney failure, unspecified: Secondary | ICD-10-CM

## 2021-09-03 LAB — URINALYSIS, COMPLETE (UACMP) WITH MICROSCOPIC
Bacteria, UA: NONE SEEN
Bilirubin Urine: NEGATIVE
Glucose, UA: NEGATIVE mg/dL
Ketones, ur: NEGATIVE mg/dL
Leukocytes,Ua: NEGATIVE
Nitrite: NEGATIVE
Protein, ur: NEGATIVE mg/dL
Specific Gravity, Urine: 1.015 (ref 1.005–1.030)
Squamous Epithelial / HPF: NONE SEEN (ref 0–5)
WBC, UA: NONE SEEN WBC/hpf (ref 0–5)
pH: 6 (ref 5.0–8.0)

## 2021-09-03 MED ORDER — KETOROLAC TROMETHAMINE 10 MG PO TABS: 10.0000 mg | ORAL_TABLET | Freq: Four times a day (QID) | ORAL | 0 refills | Status: DC | PRN

## 2021-09-03 MED ORDER — ALLOPURINOL 300 MG PO TABS: 300.0000 mg | ORAL_TABLET | Freq: Every day | ORAL | 11 refills | Status: AC

## 2021-09-03 MED ORDER — TAMSULOSIN HCL 0.4 MG PO CAPS: 0.4000 mg | ORAL_CAPSULE | Freq: Every day | ORAL | 3 refills | Status: AC

## 2021-09-03 MED ORDER — ONDANSETRON HCL 4 MG PO TABS: 4.0000 mg | ORAL_TABLET | Freq: Three times a day (TID) | ORAL | 0 refills | Status: DC | PRN

## 2021-09-03 NOTE — Progress Notes (Signed)
[facility] Posting Form  ? ?Surgery Date/Time: Date: 09/05/2021 ? ?Surgeon: Dr. Legrand Rams, MD ? ?Surgery Location: Day Surgery ? ?Inpt ( No  )   Outpt (Yes)   Obs ( No  )  ? ?Diagnosis: N20.1 Left Ureteral Stone ? ?-CPT: 52356  ? ?Surgery: Left Ureteroscopy with laser lithotripsy and stent placement ? ?Stop Anticoagulations: Yes ? ?Cardiac/Medical/Pulmonary Clearance needed: no ? ?*Orders entered into EPIC  Date: 09/03/21  ? ?*Case booked in [place]  Date: 09/03/21 ? ?*Notified pt of Surgery: Date: 09/03/21 ? ?PRE-OP UA & CX: no ? ?*Placed into Prior Authorization Work Que Date: 09/03/2021 ? ? ?Assistant/laser/rep:No ? ? ? ? ? ? ? ? ? ? ? ? ? ? ? ?

## 2021-09-03 NOTE — Telephone Encounter (Signed)
I spoke with Mr. Clifford Wong. We have discussed possible surgery dates and Thursday May 11th, 2023 was agreed upon by all parties. Patient given information about surgery date, what to expect pre-operatively and post operatively.  ?We discussed that a Pre-Admission Testing office will be calling to set up the pre-op visit that will take place prior to surgery, and that these appointments are typically done over the phone with a Pre-Admissions RN. ? Informed patient that our office will communicate any additional care to be provided after surgery. Patients questions or concerns were discussed during our call. Advised to call our office should there be any additional information, questions or concerns that arise. Patient verbalized understanding.  ? ?

## 2021-09-03 NOTE — Progress Notes (Signed)
Surgical Physician Order Form [facility] ? ?* Scheduling expectation :  Thursday, 09/05/2021 ? ?*Length of Case: 1 hour ? ?*Clearance needed: no ? ?*Anticoagulation Instructions: May continue all anticoagulants ? ?*Aspirin Instructions: Ok to continue Aspirin ? ?*Post-op visit Date/Instructions: TBD ? ?*Diagnosis: Left Ureteral Stone ? ?*Procedure: left Ureteroscopy w/laser lithotripsy & stent placement (27741) ? ? ?Additional orders: N/A ? ?-Admit type: OUTpatient ? ?-Anesthesia: General ? ?-VTE Prophylaxis Standing Order SCD?s    ?   ?Other:  ? ?-Standing Lab Orders Per Anesthesia   ? ?Lab other: None ? ?-Standing Test orders EKG/Chest x-ray per Anesthesia      ? ?Test other:  ? ?- Medications:  Ancef 2gm IV ? ?-Other orders:  N/A ? ? ? ?  ? ?

## 2021-09-03 NOTE — Progress Notes (Signed)
? ?  09/03/2021 ?2:06 PM  ? ?Clifford Wong ?1965/07/05 ?[id number] ? ?Reason for visit: Left flank pain, review 24-hour urine results ? ?HPI: ?56 year old male with long history of recurrent nephrolithiasis who recently passed multiple right-sided stones in March 2023.  He reports about a week of intermittent severe left-sided flank pain, and he had an ER visit in [place] that reportedly showed a left proximal ureteral stone and he was discharged with Toradol.  He denies any fever/chills or UTI symptoms. ? ?I personally viewed and interpreted his KUB today that shows an 8 mm left mid to proximal ureteral stone with numerous left-sided renal stones.  Urinalysis today with microscopic hematuria but otherwise benign. ? ?His 24-hour urine test notable for excellent urine volume of 3.64 L, elevated urine oxalate of 45, elevated urine uric acid of 0.91, low urine calcium of 82, elevated urine sodium of 240, pH 6.6. ? ?We discussed various treatment options for urolithiasis including observation with or without medical expulsive therapy, shockwave lithotripsy (SWL), ureteroscopy and laser lithotripsy with stent placement, and percutaneous nephrolithotomy. ? ?We discussed that management is based on stone size, location, density, patient co-morbidities, and patient preference.  ? ?Stones <18mm in size have a >80% spontaneous passage rate. Data surrounding the use of tamsulosin for medical expulsive therapy is controversial, but meta analyses suggests it is most efficacious for distal stones between 5-65mm in size. Possible side effects include dizziness/lightheadedness, and retrograde ejaculation. ? ?SWL has a lower stone free rate in a single procedure, but also a lower complication rate compared to ureteroscopy and avoids a stent and associated stent related symptoms. Possible complications include renal hematoma, steinstrasse, and need for additional treatment. ? ?Ureteroscopy with laser lithotripsy and stent  placement has a higher stone free rate than SWL in a single procedure, however increased complication rate including possible infection, ureteral injury, bleeding, and stent related morbidity. Common stent related symptoms include dysuria, urgency/frequency, and flank pain. ? ?-After an extensive discussion of the risks and benefits of the above treatment options and using shared decision making, the patient would like to proceed with left ureteroscopy, laser lithotripsy, and stent placement to address all of his left-sided stones.  We will schedule this week ? ?-Continue potassium citrate and allopurinol for stone prevention ? ? ?Sondra Come, MD ? ?[facility] ?[address] ?[place], [place] [address] ?[phone] ? ? ?

## 2021-09-03 NOTE — Patient Instructions (Signed)
Laser Therapy for Kidney Stones ?Laser therapy for kidney stones is a procedure to break up small, hard mineral deposits that form in the kidney (kidney stones). The procedure is done using a device that produces a focused beam of light (laser). The laser breaks up kidney stones into pieces that are small enough to be passed out of the body through urination or removed from the body during the procedure. You may need laser therapy if you have kidney stones that are painful or block your urinary tract. ?This procedure is done by inserting a tube (ureteroscope) into your kidney through the urethral opening. The urethra is the part of the body that drains urine from the bladder. In women, the urethra opens above the vaginal opening. In men, the urethra opens at the tip of the penis. The ureteroscope is inserted through the urethra, and surgical instruments are moved through the bladder and the muscular tube that connects the kidney to the bladder (ureter) until they reach the kidney. ?Tell a health care provider about: ?Any allergies you have. ?All medicines you are taking, including vitamins, herbs, eye drops, creams, and over-the-counter medicines. ?Any problems you or family members have had with anesthetic medicines. ?Any blood disorders you have. ?Any surgeries you have had. ?Any medical conditions you have. ?Whether you are pregnant or may be pregnant. ?What are the risks? ?Generally, this is a safe procedure. However, problems may occur, including: ?Infection. ?Bleeding. ?Allergic reactions to medicines. ?Damage to the urethra, bladder, or ureter. ?Urinary tract infection (UTI). ?Narrowing of the urethra (urethral stricture). ?Difficulty passing urine. ?Blockage of the kidney caused by a fragment of kidney stone. ?What happens before the procedure? ?Medicines ?Ask your health care provider about: ?Changing or stopping your regular medicines. This is especially important if you are taking diabetes medicines or  blood thinners. ?Taking medicines such as aspirin and ibuprofen. These medicines can thin your blood. Do not take these medicines unless your health care provider tells you to take them. ?Taking over-the-counter medicines, vitamins, herbs, and supplements. ?Eating and drinking ?Follow instructions from your health care provider about eating and drinking, which may include: ?8 hours before the procedure - stop eating heavy meals or foods, such as meat, fried foods, or fatty foods. ?6 hours before the procedure - stop eating light meals or foods, such as toast or cereal. ?6 hours before the procedure - stop drinking milk or drinks that contain milk. ?2 hours before the procedure - stop drinking clear liquids. ?Staying hydrated ?Follow instructions from your health care provider about hydration, which may include: ?Up to 2 hours before the procedure - you may continue to drink clear liquids, such as water, clear fruit juice, black coffee, and plain tea. ? ?General instructions ?You may have a physical exam before the procedure. You may also have tests, such as imaging tests and blood or urine tests. ?If your ureter is too narrow, your health care provider may place a soft, flexible tube (stent) inside of it. The stent may be placed days or weeks before your laser therapy procedure. ?Plan to have someone take you home from the hospital or clinic. ?If you will be going home right after the procedure, plan to have someone stay with you for 24 hours. ?Do not use any products that contain nicotine or tobacco for at least 4 weeks before the procedure. These products include cigarettes, e-cigarettes, and chewing tobacco. If you need help quitting, ask your health care provider. ?Ask your health care provider: ?How your   surgical site will be marked or identified. ?What steps will be taken to help prevent infection. These may include: ?Removing hair at the surgery site. ?Washing skin with a germ-killing soap. ?Taking antibiotic  medicine. ?What happens during the procedure? ? ?An IV will be inserted into one of your veins. ?You will be given one or more of the following: ?A medicine to help you relax (sedative). ?A medicine to numb the area (local anesthetic). ?A medicine to make you fall asleep (general anesthetic). ?A ureteroscope will be inserted into your urethra. The ureteroscope will send images to a video screen in the operating room to guide your surgeon to the area of your kidney that will be treated. ?A small, flexible tube will be threaded through the ureteroscope and into your bladder and ureter, up to your kidney. ?The laser device will be inserted into your kidney through the tube. Your surgeon will pulse the laser on and off to break up kidney stones. ?A surgical instrument that has a tiny wire basket may be inserted through the tube into your kidney to remove the pieces of broken kidney stone. ?The procedure may vary among health care providers and hospitals. ?What happens after the procedure? ?Your blood pressure, heart rate, breathing rate, and blood oxygen level will be monitored until you leave the hospital or clinic. ?You will be given pain medicine as needed. ?You may continue to receive antibiotics. ?You may have a stent temporarily placed in your ureter. ?Do not drive for 24 hours if you were given a sedative during your procedure. ?You may be given a strainer to collect any stone fragments that you pass in your urine. Your health care provider may have these tested. ?Summary ?Laser therapy for kidney stones is a procedure to break up kidney stones into pieces that are small enough to be passed out of the body through urination or removed during the procedure. ?Follow instructions from your health care provider about eating and drinking before the procedure. ?During the procedure, the ureteroscope will send images to a video screen to guide your surgeon to the area of your kidney that will be treated. ?Do not drive  for 24 hours if you were given a sedative during your procedure. ?This information is not intended to replace advice given to you by your health care provider. Make sure you discuss any questions you have with your health care provider. ?Document Revised: 12/17/2020 Document Reviewed: 12/17/2020 ?Elsevier Patient Education ? 2023 Elsevier Inc. ? ?Ureteral Stent Implantation ? ?Ureteral stent implantation is a procedure to insert (implant) a flexible, soft, plastic tube (stent) into a ureter. Ureters are the tube-like parts of the body that drain urine from the kidneys. The stent supports the ureter while it heals and helps to drain urine. You may have a ureteral stent implanted after having a procedure to remove a blockage from the ureter (ureterolysis or pyeloplasty). You may also have a stent implanted to open the flow of urine when you have a blockage caused by a kidney stone, tumor, blood clot, or infection. ?You have two ureters, one on each side of the body. The ureters connect the kidneys to the organ that holds urine until it passes out of the body (bladder). The stent is placed so that one end is in the kidney, and one end is in the bladder. The stent is usually taken out after your ureter has healed. Depending on your condition, you may have a stent for just a few weeks, or you may  have a long-term stent that will need to be replaced every few months. ?Tell a health care provider about: ?Any allergies you have. ?All medicines you are taking, including vitamins, herbs, eye drops, creams, and over-the-counter medicines. ?Any problems you or family members have had with anesthetic medicines. ?Any blood disorders you have. ?Any surgeries you have had. ?Any medical conditions you have. ?Whether you are pregnant or may be pregnant. ?What are the risks? ?Generally, this is a safe procedure. However, problems may occur, including: ?Infection. ?Bleeding. ?Allergic reactions to medicines. ?Damage to other structures  or organs. Tearing (perforation) of the ureter is possible. ?Movement of the stent away from where it is placed during surgery (migration). ?What happens before the procedure? ?Medicines ?Ask your heal

## 2021-09-04 ENCOUNTER — Encounter: Payer: Self-pay | Admitting: Urology

## 2021-09-04 MED ORDER — CEFAZOLIN SODIUM-DEXTROSE 2-4 GM/100ML-% IV SOLN
2.0000 g | INTRAVENOUS | Status: AC
  Administered 2021-09-05: 2 g via INTRAVENOUS

## 2021-09-04 MED ORDER — SODIUM CHLORIDE 0.9 % IV SOLN: INTRAVENOUS | Status: DC

## 2021-09-04 MED ORDER — ORAL CARE MOUTH RINSE: 15.0000 mL | Freq: Once | OROMUCOSAL | Status: AC

## 2021-09-04 MED ORDER — CHLORHEXIDINE GLUCONATE 0.12 % MT SOLN: 15.0000 mL | Freq: Once | OROMUCOSAL | Status: AC

## 2021-09-04 NOTE — Telephone Encounter (Signed)
8 stones on right side.  Can you please write him a work excuse note for those dates, thanks ? ?Legrand Rams, MD ?09/04/2021 ? ?

## 2021-09-05 ENCOUNTER — Ambulatory Visit: Payer: Managed Care, Other (non HMO) | Admitting: Anesthesiology

## 2021-09-05 ENCOUNTER — Encounter
Admission: RE | Disposition: A | Discharge status: Home / Self Care | Payer: Self-pay | Source: Home / Self Care | Attending: Urology

## 2021-09-05 ENCOUNTER — Ambulatory Visit: Payer: Managed Care, Other (non HMO)

## 2021-09-05 ENCOUNTER — Other Ambulatory Visit: Payer: Self-pay

## 2021-09-05 ENCOUNTER — Encounter: Payer: Self-pay | Admitting: Urology

## 2021-09-05 ENCOUNTER — Ambulatory Visit
Admission: RE | Admit: 2021-09-05 | Discharge: 2021-09-05 | Disposition: A | Discharge status: Home / Self Care | Payer: Managed Care, Other (non HMO) | Attending: Urology | Admitting: Urology

## 2021-09-05 DIAGNOSIS — I10 Essential (primary) hypertension: Secondary | ICD-10-CM | POA: Insufficient documentation

## 2021-09-05 DIAGNOSIS — N202 Calculus of kidney with calculus of ureter: Secondary | ICD-10-CM | POA: Insufficient documentation

## 2021-09-05 DIAGNOSIS — E119 Type 2 diabetes mellitus without complications: Secondary | ICD-10-CM | POA: Insufficient documentation

## 2021-09-05 DIAGNOSIS — E785 Hyperlipidemia, unspecified: Secondary | ICD-10-CM | POA: Diagnosis not present

## 2021-09-05 DIAGNOSIS — N23 Unspecified renal colic: Secondary | ICD-10-CM | POA: Diagnosis not present

## 2021-09-05 DIAGNOSIS — N201 Calculus of ureter: Secondary | ICD-10-CM

## 2021-09-05 DIAGNOSIS — N200 Calculus of kidney: Secondary | ICD-10-CM

## 2021-09-05 HISTORY — PX: CYSTOSCOPY/URETEROSCOPY/HOLMIUM LASER/STENT PLACEMENT: SHX6546

## 2021-09-05 SURGERY — CYSTOSCOPY/URETEROSCOPY/HOLMIUM LASER/STENT PLACEMENT
Anesthesia: General | Laterality: Left

## 2021-09-05 MED ORDER — DEXAMETHASONE SODIUM PHOSPHATE 10 MG/ML IJ SOLN
INTRAMUSCULAR | Status: DC | PRN
  Administered 2021-09-05: 10 mg via INTRAVENOUS

## 2021-09-05 MED ORDER — ONDANSETRON HCL 4 MG/2ML IJ SOLN
4.0000 mg | Freq: Once | INTRAMUSCULAR | Status: DC | PRN
Start: 2021-09-05 — End: 2021-09-06

## 2021-09-05 MED ORDER — FENTANYL CITRATE (PF) 100 MCG/2ML IJ SOLN
INTRAMUSCULAR | Status: DC | PRN
  Administered 2021-09-05 (×2): 50 ug via INTRAVENOUS

## 2021-09-05 MED ORDER — FENTANYL CITRATE (PF) 100 MCG/2ML IJ SOLN
INTRAMUSCULAR | Status: AC
  Filled 2021-09-05: qty 2

## 2021-09-05 MED ORDER — MIDAZOLAM HCL 2 MG/2ML IJ SOLN
INTRAMUSCULAR | Status: DC | PRN
  Administered 2021-09-05: 2 mg via INTRAVENOUS

## 2021-09-05 MED ORDER — EPHEDRINE SULFATE (PRESSORS) 50 MG/ML IJ SOLN
INTRAMUSCULAR | Status: DC | PRN
  Administered 2021-09-05: 10 mg via INTRAVENOUS

## 2021-09-05 MED ORDER — ONDANSETRON HCL 4 MG/2ML IJ SOLN
INTRAMUSCULAR | Status: DC | PRN
  Administered 2021-09-05: 4 mg via INTRAVENOUS

## 2021-09-05 MED ORDER — SODIUM CHLORIDE 0.9 % IR SOLN
Status: DC | PRN
  Administered 2021-09-05: 3000 mL

## 2021-09-05 MED ORDER — FENTANYL CITRATE (PF) 100 MCG/2ML IJ SOLN: 25.0000 ug | INTRAMUSCULAR | Status: DC | PRN

## 2021-09-05 MED ORDER — OXYBUTYNIN CHLORIDE ER 10 MG PO TB24: 10.0000 mg | ORAL_TABLET | Freq: Every day | ORAL | 0 refills | Status: DC | PRN

## 2021-09-05 MED ORDER — ROCURONIUM BROMIDE 100 MG/10ML IV SOLN
INTRAVENOUS | Status: DC | PRN
  Administered 2021-09-05: 20 mg via INTRAVENOUS
  Administered 2021-09-05: 10 mg via INTRAVENOUS
  Administered 2021-09-05: 40 mg via INTRAVENOUS

## 2021-09-05 MED ORDER — ACETAMINOPHEN 10 MG/ML IV SOLN: 1000.0000 mg | Freq: Once | INTRAVENOUS | Status: DC | PRN

## 2021-09-05 MED ORDER — MIDAZOLAM HCL 2 MG/2ML IJ SOLN
INTRAMUSCULAR | Status: AC
  Filled 2021-09-05: qty 2

## 2021-09-05 MED ORDER — CHLORHEXIDINE GLUCONATE 0.12 % MT SOLN
OROMUCOSAL | Status: AC
  Administered 2021-09-05: 15 mL via OROMUCOSAL
  Filled 2021-09-05: qty 15

## 2021-09-05 MED ORDER — ACETAMINOPHEN 10 MG/ML IV SOLN
INTRAVENOUS | Status: DC | PRN
Start: 2021-09-05 — End: 2021-09-05
  Administered 2021-09-05: 1000 mg via INTRAVENOUS

## 2021-09-05 MED ORDER — OXYCODONE HCL 5 MG PO TABS: 5.0000 mg | ORAL_TABLET | Freq: Once | ORAL | Status: DC | PRN

## 2021-09-05 MED ORDER — ACETAMINOPHEN 10 MG/ML IV SOLN
INTRAVENOUS | Status: AC
  Filled 2021-09-05: qty 100

## 2021-09-05 MED ORDER — LIDOCAINE HCL (CARDIAC) PF 100 MG/5ML IV SOSY
PREFILLED_SYRINGE | INTRAVENOUS | Status: DC | PRN
  Administered 2021-09-05: 100 mg via INTRAVENOUS

## 2021-09-05 MED ORDER — SUCCINYLCHOLINE CHLORIDE 200 MG/10ML IV SOSY
PREFILLED_SYRINGE | INTRAVENOUS | Status: DC | PRN
  Administered 2021-09-05: 120 mg via INTRAVENOUS

## 2021-09-05 MED ORDER — IOHEXOL 180 MG/ML  SOLN
INTRAMUSCULAR | Status: DC | PRN
  Administered 2021-09-05: 10 mL

## 2021-09-05 MED ORDER — SUGAMMADEX SODIUM 200 MG/2ML IV SOLN
INTRAVENOUS | Status: DC | PRN
Start: 2021-09-05 — End: 2021-09-05
  Administered 2021-09-05: 200 mg via INTRAVENOUS

## 2021-09-05 MED ORDER — PROPOFOL 10 MG/ML IV BOLUS
INTRAVENOUS | Status: DC | PRN
  Administered 2021-09-05: 180 mg via INTRAVENOUS

## 2021-09-05 MED ORDER — OXYCODONE HCL 5 MG PO TABS: 5.0000 mg | ORAL_TABLET | Freq: Three times a day (TID) | ORAL | 0 refills | Status: AC | PRN

## 2021-09-05 MED ORDER — CEFAZOLIN SODIUM-DEXTROSE 2-4 GM/100ML-% IV SOLN
INTRAVENOUS | Status: AC
  Filled 2021-09-05: qty 100

## 2021-09-05 MED ORDER — OXYCODONE HCL 5 MG/5ML PO SOLN: 5.0000 mg | Freq: Once | ORAL | Status: DC | PRN

## 2021-09-05 MED ORDER — KETOROLAC TROMETHAMINE 30 MG/ML IJ SOLN
INTRAMUSCULAR | Status: DC | PRN
  Administered 2021-09-05: 30 mg via INTRAVENOUS

## 2021-09-05 MED ORDER — LACTATED RINGERS IV SOLN: INTRAVENOUS | Status: DC | PRN

## 2021-09-05 MED ORDER — LACTATED RINGERS IV SOLN: INTRAVENOUS | Status: DC

## 2021-09-05 SURGICAL SUPPLY — 29 items
BAG DRAIN CYSTO-URO LG1000N (MISCELLANEOUS) ×2 IMPLANT
BRUSH SCRUB EZ 1% IODOPHOR (MISCELLANEOUS) ×2 IMPLANT
CATH URET FLEX-TIP 2 LUMEN 10F (CATHETERS) IMPLANT
CATH URETL OPEN 5X70 (CATHETERS) ×1 IMPLANT
CNTNR SPEC 2.5X3XGRAD LEK (MISCELLANEOUS)
CONT SPEC 4OZ STER OR WHT (MISCELLANEOUS)
CONT SPEC 4OZ STRL OR WHT (MISCELLANEOUS)
CONTAINER SPEC 2.5X3XGRAD LEK (MISCELLANEOUS) IMPLANT
DRAPE UTILITY 15X26 TOWEL STRL (DRAPES) ×2 IMPLANT
DRSG TEGADERM 2-3/8X2-3/4 SM (GAUZE/BANDAGES/DRESSINGS) IMPLANT
FIBER LASER MOSES 200 DFL (Laser) ×1 IMPLANT
GLOVE SURG UNDER POLY LF SZ7.5 (GLOVE) ×2 IMPLANT
GOWN STRL REUS W/ TWL LRG LVL3 (GOWN DISPOSABLE) ×1 IMPLANT
GOWN STRL REUS W/ TWL XL LVL3 (GOWN DISPOSABLE) ×1 IMPLANT
GOWN STRL REUS W/TWL LRG LVL3 (GOWN DISPOSABLE) ×2
GOWN STRL REUS W/TWL XL LVL3 (GOWN DISPOSABLE) ×2
GUIDEWIRE STR DUAL SENSOR (WIRE) ×2 IMPLANT
IV NS IRRIG 3000ML ARTHROMATIC (IV SOLUTION) ×2 IMPLANT
KIT TURNOVER CYSTO (KITS) ×2 IMPLANT
PACK CYSTO AR (MISCELLANEOUS) ×2 IMPLANT
SET CYSTO W/LG BORE CLAMP LF (SET/KITS/TRAYS/PACK) ×2 IMPLANT
SHEATH NAVIGATOR HD 12/14X36 (SHEATH) IMPLANT
STENT URET 6FRX24 CONTOUR (STENTS) IMPLANT
STENT URET 6FRX26 CONTOUR (STENTS) IMPLANT
STENT URET 6FRX28 CONTOUR (STENTS) ×1 IMPLANT
SURGILUBE 2OZ TUBE FLIPTOP (MISCELLANEOUS) ×2 IMPLANT
SYR 10ML LL (SYRINGE) ×2 IMPLANT
VALVE UROSEAL ADJ ENDO (VALVE) IMPLANT
WATER STERILE IRR 500ML POUR (IV SOLUTION) ×2 IMPLANT

## 2021-09-05 NOTE — Anesthesia Procedure Notes (Signed)
Procedure Name: Intubation ?Date/Time: 09/05/2021 5:15 PM ?Performed by: Irving Burton, CRNA ?Pre-anesthesia Checklist: Patient identified, Patient being monitored, Timeout performed, Emergency Drugs available and Suction available ?Patient Re-evaluated:Patient Re-evaluated prior to induction ?Oxygen Delivery Method: Circle system utilized ?Preoxygenation: Pre-oxygenation with 100% oxygen ?Induction Type: IV induction ?Ventilation: Mask ventilation without difficulty ?Laryngoscope Size: McGraph and 4 ?Grade View: Grade I ?Tube type: Oral ?Tube size: 7.5 mm ?Number of attempts: 1 ?Airway Equipment and Method: Stylet and Video-laryngoscopy ?Placement Confirmation: ETT inserted through vocal cords under direct vision, positive ETCO2 and breath sounds checked- equal and bilateral ?Secured at: 22 cm ?Tube secured with: Tape ?Dental Injury: Teeth and Oropharynx as per pre-operative assessment  ? ? ? ? ?

## 2021-09-05 NOTE — Transfer of Care (Signed)
Immediate Anesthesia Transfer of Care Note ? ?Patient: Clifford Wong ? ?Procedure(s) Performed: CYSTOSCOPY/URETEROSCOPY/HOLMIUM LASER/STENT PLACEMENT (Left) ? ?Patient Location: PACU ? ?Anesthesia Type:General ? ?Level of Consciousness: awake, alert  and oriented ? ?Airway & Oxygen Therapy: Patient Spontanous Breathing and Patient connected to face mask oxygen ? ?Post-op Assessment: Report given to RN and Post -op Vital signs reviewed and stable ? ?Post vital signs: Reviewed and stable ? ?Last Vitals:  ?Vitals Value Taken Time  ?BP 174/96 09/05/21 1820  ?Temp 36.2 ?C 09/05/21 1820  ?Pulse 100 09/05/21 1824  ?Resp 20 09/05/21 1824  ?SpO2 100 % 09/05/21 1824  ?Vitals shown include unvalidated device data. ? ?Last Pain:  ?Vitals:  ? 09/05/21 1326  ?PainSc: 0-No pain  ?   ? ?  ? ?Complications: No notable events documented. ?

## 2021-09-05 NOTE — H&P (Signed)
? ?  09/05/21 ?6:40 PM  ? ?Clifford Wong ?1965-05-09 ?[id number] ? ?CC: Left flank pain ? ?HPI: ?56 year old male with recurrent stone disease who presented to clinic earlier this week with poorly controlled pain from an 8 mm left proximal ureteral stone.  He had numerous left-sided renal stones as well.  He opted for left ureteroscopy and laser lithotripsy. ? ? ?PMH: ?Past Medical History:  ?Diagnosis Date  ? Hyperlipidemia   ? Hypertension   ? Type 2 diabetes mellitus ([place])   ? ? ?Surgical History: ?History reviewed. No pertinent surgical history. ? ?Family History: ?Family History  ?Problem Relation Age of Onset  ? Gout Mother   ? Gout Father   ? ? ?Social History:  reports that he has never smoked. He has never been exposed to tobacco smoke. He has never used smokeless tobacco. He reports that he does not currently use alcohol. He reports that he does not use drugs. ? ?Physical Exam: ?BP (!) 152/80   Pulse (!) 101   Temp (!) 97.1 ?F (36.2 ?C)   Resp (!) 22   SpO2 100%   ? ?Constitutional:  Alert and oriented, No acute distress. ?Cardiovascular: Regular rate and rhythm ?Respiratory: Clear to auscultation bilaterally ?GI: Abdomen is soft, nontender, nondistended, no abdominal masses ? ? ?Laboratory Data: ?Urinalysis with 20-50 RBCs but otherwise benign ? ?Assessment & Plan:   ?56 year old male with left-sided flank pain secondary to an 8 mm left proximal ureteral stone, numerous left renal stones, here today for definitive management with ureteroscopy. ? ?We specifically discussed the risks ureteroscopy including bleeding, infection/sepsis, stent related symptoms including flank pain/urgency/frequency/incontinence/dysuria, ureteral injury, inability to access stone, or need for staged or additional procedures. ? ?Left ureteroscopy, laser lithotripsy, stent placement today ? ?Nickolas Madrid, MD ?09/05/2021 ? ?[facility] ?[address] ?[place], [place] [address] ?[phone] ? ? ?

## 2021-09-05 NOTE — Discharge Instructions (Signed)
AMBULATORY SURGERY  ?DISCHARGE INSTRUCTIONS ? ? ?The drugs that you were given will stay in your system until tomorrow so for the next 24 hours you should not: ? ?Drive an automobile ?Make any legal decisions ?Drink any alcoholic beverage ? ? ?You may resume regular meals tomorrow.  Today it is better to start with liquids and gradually work up to solid foods. ? ?You may eat anything you prefer, but it is better to start with liquids, then soup and crackers, and gradually work up to solid foods. ? ? ?Please notify your doctor immediately if you have any unusual bleeding, trouble breathing, redness and pain at the surgery site, drainage, fever, or pain not relieved by medication. ? ? ? ?Additional Instructions: ? ? ? ?Please contact your physician with any problems or Same Day Surgery at [phone], Monday through Friday 6 am to 4 pm, or [place] [facility] number at [phone].  ?

## 2021-09-05 NOTE — Op Note (Signed)
Date of procedure: 09/05/21 ? ?Preoperative diagnosis:  ?Left ureteral stone  ?Left renal stones ? ?Postoperative diagnosis:  ?Left renal stones ? ?Procedure: ?Cystoscopy, left ureteroscopy, laser lithotripsy, left retrograde pyelogram with intraoperative interpretation, left ureteral stent placement ? ?Surgeon: Nickolas Madrid, MD ? ?Anesthesia: General ? ?Complications: None ? ?Intraoperative findings:  ?Normal cystoscopy ?Uncomplicated dusting of all left renal stones and stent placement ? ?EBL: Minimal ? ?Specimens: None ? ?Drains: Left 6 French by 28 cm ureteral stent ? ?Indication: Clifford Wong is a 56 y.o. patient with 8 mm proximal left ureteral stone, numerous renal stones, and uncontrolled renal colic.  After reviewing the management options for treatment, they elected to proceed with the above surgical procedure(s). We have discussed the potential benefits and risks of the procedure, side effects of the proposed treatment, the likelihood of the patient achieving the goals of the procedure, and any potential problems that might occur during the procedure or recuperation. Informed consent has been obtained. ? ?Description of procedure: ? ?The patient was taken to the operating room and general anesthesia was induced. SCDs were placed for DVT prophylaxis. The patient was placed in the dorsal lithotomy position, prepped and draped in the usual sterile fashion, and preoperative antibiotics(Ancef) were administered. A preoperative time-out was performed.  ? ?A 21 French rigid cystoscope was used to intubate the urethra and a normal-appearing urethra was followed proximally into the bladder.  The prostate was moderate in size, and the bladder was grossly normal with no suspicious lesions.  On fluoroscopy there is no definite evidence of the previously seen left proximal ureteral stone. ? ?A sensor wire passed easily in the left ureteral orifice and advanced easily up to the kidney under fluoroscopic vision.   A semirigid ureteroscope advanced easily along the wire and there were no stones visualized within the ureter, and there was some ureteral erythema proximally where I suspect the stone had previously been lodged. ? ?A digital single-channel flexible ureteroscope then advanced easily over the wire up into the kidney under fluoroscopic vision.  Thorough pyeloscopy revealed that nearly every single calyx was filled with a 5 to 8 mm stone.  The stones were black and appeared to be calcium oxalate.  Starting in the upper pole, a 200 ?m laser fiber on settings of 0.5 J and 80 Hz was used to methodically dust the stones.  I worked my way down into the lower pole until all stones have been dusted to <1 mm fragments.  On fluoroscopy no other stone fragments could be seen. ? ?Thorough pyeloscopy revealed no other significant stone fragments.  A retrograde pyelogram was performed from the proximal ureter and showed no extravasation or filling defects.  A wire was replaced through the scope and pullback ureteroscopy showed no ureteral injury or residual stones. ? ?The rigid cystoscope was backloaded over the wire and a 6 [place] by 28 cm ureteral stent was uneventfully placed with a curl in the upper pole, as well as under direct vision in the bladder.  Contrast drained through the side ports of the stent.  The bladder was drained and this concluded the procedure. ? ?Disposition: Stable to PACU ? ?Plan: ?Stent removal in clinic in 2 weeks(week of May 29 through June 2) ?If patient not tolerating stent well, can follow-up in 1 to 2 weeks with Dr. Erlene Quan for stent removal ? ?Nickolas Madrid, MD ? ?

## 2021-09-05 NOTE — Anesthesia Preprocedure Evaluation (Addendum)
Anesthesia Evaluation  ?Patient identified by MRN, date of birth, ID band ?Patient awake ? ? ? ?Reviewed: ?Allergy & Precautions, NPO status , Patient's Chart, lab work & pertinent test results ? ?History of Anesthesia Complications ?Negative for: history of anesthetic complications ? ?Airway ?Mallampati: I ? ? ?Neck ROM: Full ? ? ? Dental ? ?(+) Missing ?  ?Pulmonary ?neg pulmonary ROS,  ?  ?Pulmonary exam normal ?breath sounds clear to auscultation ? ? ? ? ? ? Cardiovascular ?hypertension, Normal cardiovascular exam ?Rhythm:Regular Rate:Normal ? ? ?  ?Neuro/Psych ?negative neurological ROS ?   ? GI/Hepatic ?negative GI ROS,   ?Endo/Other  ?negative endocrine ROS ? Renal/GU ?Renal disease (nephrolithiasis)  ? ?  ?Musculoskeletal ? ? Abdominal ?  ?Peds ? Hematology ?negative hematology ROS ?(+)   ?Anesthesia Other Findings ? ? Reproductive/Obstetrics ? ?  ? ? ? ? ? ? ? ? ? ? ? ? ? ?  ?  ? ? ? ? ? ? ? ?Anesthesia Physical ?Anesthesia Plan ? ?ASA: 2 ? ?Anesthesia Plan: General  ? ?Post-op Pain Management:   ? ?Induction: Intravenous ? ?PONV Risk Score and Plan: 2 and Ondansetron, Dexamethasone and Treatment may vary due to age or medical condition ? ?Airway Management Planned: Oral ETT ? ?Additional Equipment:  ? ?Intra-op Plan:  ? ?Post-operative Plan: Extubation in OR ? ?Informed Consent: I have reviewed the patients History and Physical, chart, labs and discussed the procedure including the risks, benefits and alternatives for the proposed anesthesia with the patient or authorized representative who has indicated his/her understanding and acceptance.  ? ? ? ?Dental advisory given ? ?Plan Discussed with: CRNA ? ?Anesthesia Plan Comments: (Patient consented for risks of anesthesia including but not limited to:  ?- adverse reactions to medications ?- damage to eyes, teeth, lips or other oral mucosa ?- nerve damage due to positioning  ?- sore throat or hoarseness ?- damage to heart,  brain, nerves, lungs, other parts of body or loss of life ? ?Informed patient about role of CRNA in peri- and intra-operative care.  Patient voiced understanding.)  ? ? ? ? ? ?Anesthesia Quick Evaluation ? ?

## 2021-09-06 ENCOUNTER — Encounter: Payer: Self-pay | Admitting: Urology

## 2021-09-06 NOTE — Anesthesia Postprocedure Evaluation (Signed)
Anesthesia Post Note ? ?Patient: Clifford Wong ? ?Procedure(s) Performed: CYSTOSCOPY/URETEROSCOPY/HOLMIUM LASER/STENT PLACEMENT (Left) ? ?Patient location during evaluation: PACU ?Anesthesia Type: General ?Level of consciousness: awake and alert ?Pain management: pain level controlled ?Vital Signs Assessment: post-procedure vital signs reviewed and stable ?Respiratory status: spontaneous breathing, nonlabored ventilation and respiratory function stable ?Cardiovascular status: blood pressure returned to baseline and stable ?Postop Assessment: no apparent nausea or vomiting ?Anesthetic complications: no ? ? ?No notable events documented. ? ? ?Last Vitals:  ?Vitals:  ? 09/05/21 1900 09/05/21 1905  ?BP: (!) 159/95 (!) 164/102  ?Pulse: 92 91  ?Resp: 15 18  ?Temp:    ?SpO2: 99% 97%  ?  ?Last Pain:  ?Vitals:  ? 09/06/21 0912  ?PainSc: 0-No pain  ? ? ?  ?  ?  ?  ?  ?  ? ?Foye Deer ? ? ? ? ?

## 2021-09-24 ENCOUNTER — Ambulatory Visit: Payer: Managed Care, Other (non HMO) | Admitting: Urology

## 2021-09-26 ENCOUNTER — Ambulatory Visit (INDEPENDENT_AMBULATORY_CARE_PROVIDER_SITE_OTHER): Payer: Managed Care, Other (non HMO) | Admitting: Urology

## 2021-09-26 ENCOUNTER — Encounter: Payer: Self-pay | Admitting: Urology

## 2021-09-26 VITALS — BP 141/96 | HR 94 | Ht 73.0 in | Wt 235.0 lb

## 2021-09-26 DIAGNOSIS — N200 Calculus of kidney: Secondary | ICD-10-CM

## 2021-09-26 DIAGNOSIS — Z466 Encounter for fitting and adjustment of urinary device: Secondary | ICD-10-CM

## 2021-09-26 MED ORDER — SULFAMETHOXAZOLE-TRIMETHOPRIM 800-160 MG PO TABS
1.0000 | ORAL_TABLET | Freq: Once | ORAL | Status: AC
  Administered 2021-09-26: 1 via ORAL

## 2021-09-26 NOTE — Progress Notes (Addendum)
Cystoscopy Procedure Note:  Indication: Stent removal s/p LEFT URS/LL/stent for extensive left renal stone burden  Bactrim given for prophylaxis  After informed consent and discussion of the procedure and its risks, Clifford Wong was positioned and prepped in the standard fashion. Cystoscopy was performed with a flexible cystoscope. The stent was grasped with flexible graspers and removed in its entirety. The patient tolerated the procedure well.  Findings: Uncomplicated stent removal  Assessment and Plan: He would like to address his numerous right-sided renal stones with ureteroscopy in October 2023, and he will call to schedule   Sondra Come, MD 09/26/2021

## 2021-09-26 NOTE — Patient Instructions (Signed)
will call to schedule ureteroscopy in October

## 2021-09-26 NOTE — Addendum Note (Signed)
Addended by: Sueanne Margarita on: 09/26/2021 04:17 PM   Modules accepted: Orders

## 2023-12-22 IMAGING — CR DG ABDOMEN 1V
2 series · 2 of 2 positions shown · non-contrast
Comparison: CT abdomen pelvis 07/08/2021

CLINICAL DATA: Left flank pain.

EXAM:
ABDOMEN - 1 VIEW

[abdomen kub (1 of 2)]
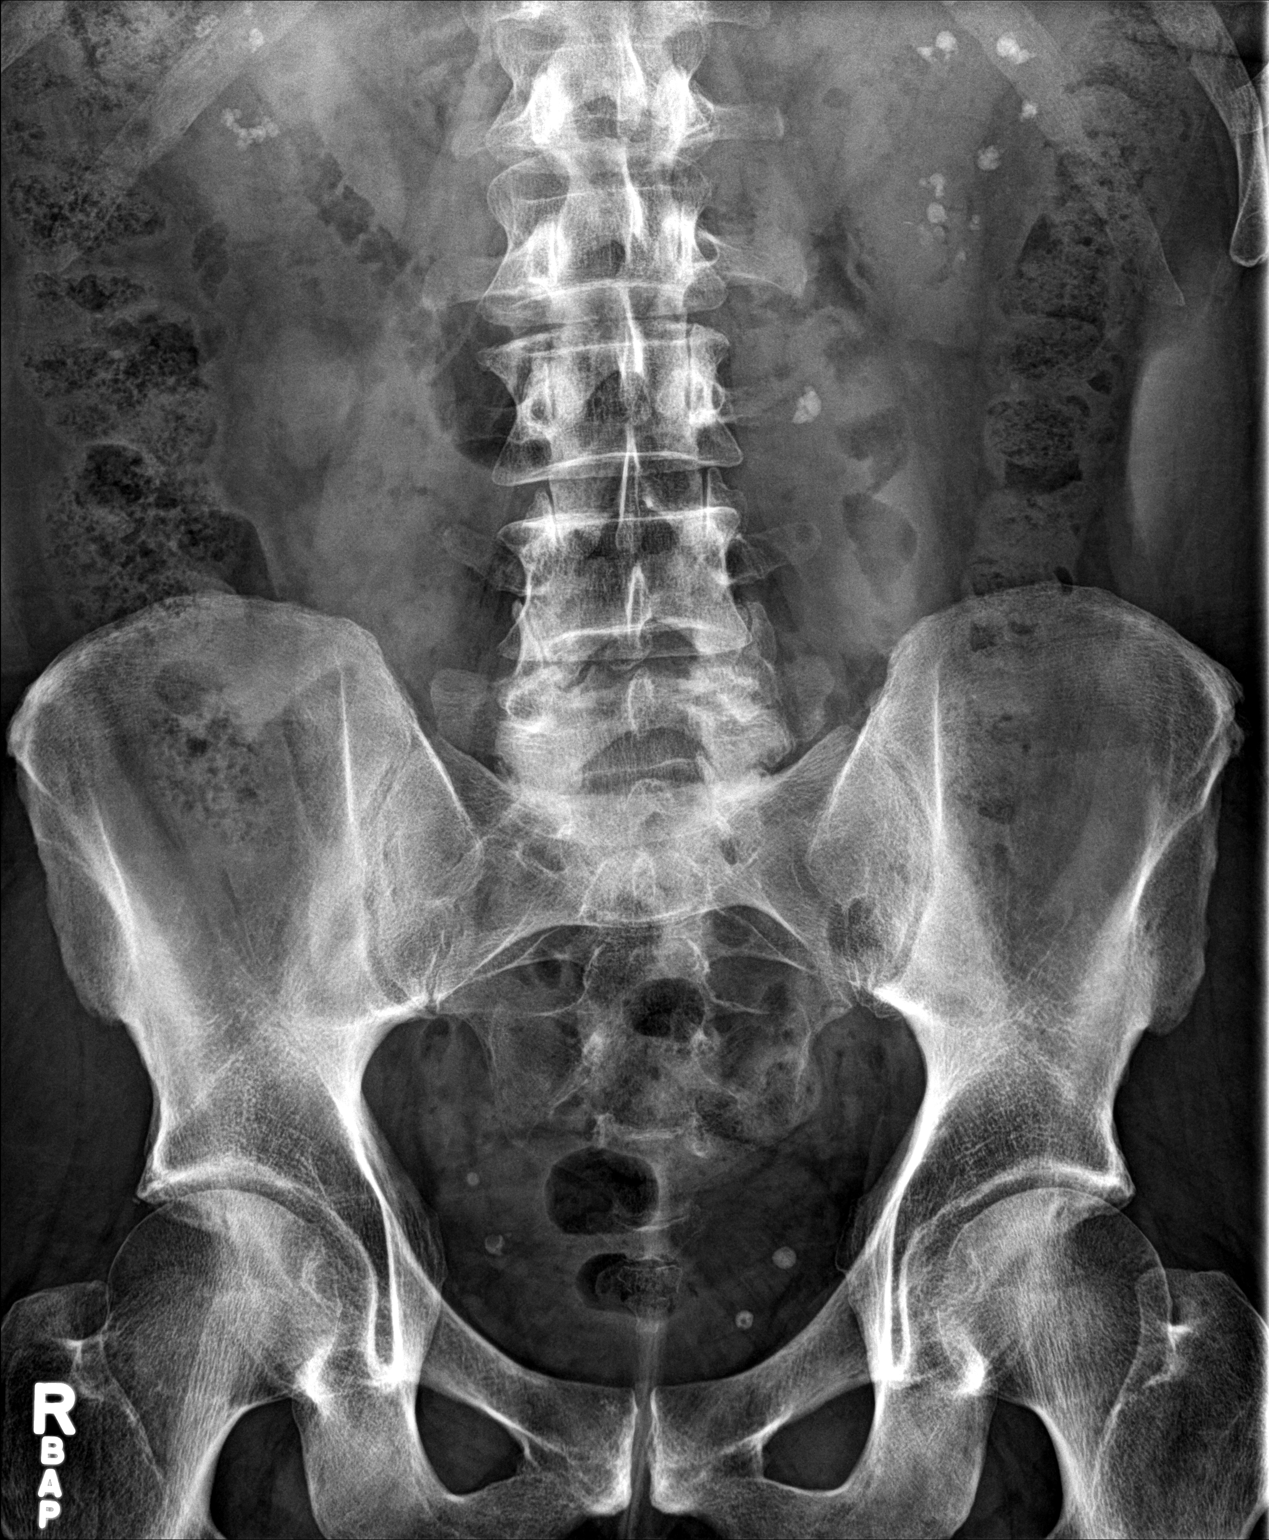

[abdomen kub (2 of 2)]
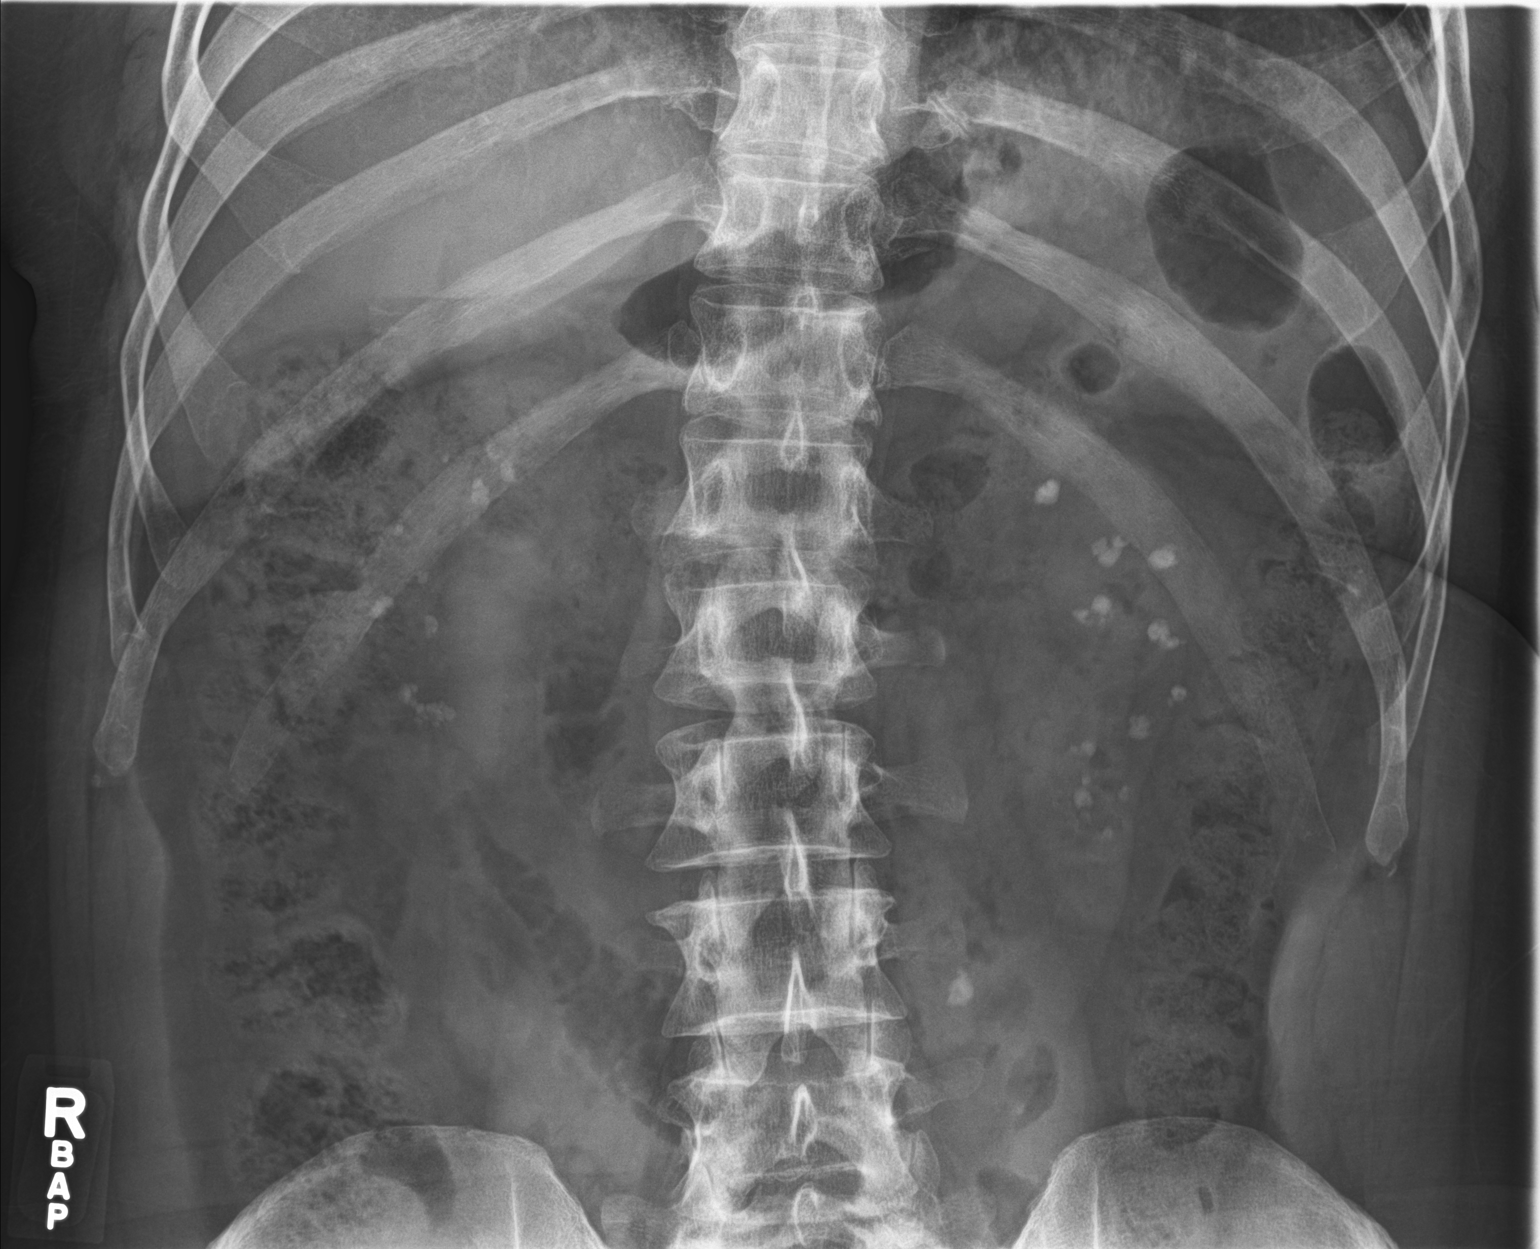

[2 of 2 positions shown; findings below may reference images not displayed]

FINDINGS: Bilateral nephrolithiasis. Additionally, there is an 8 mm
calcification projected in the expected location of the mid left
ureter, potentially representing left ureteral stone. Pelvic
phleboliths. Stool throughout the colon. Osseous structures
unremarkable.
IMPRESSION: Possible 8 mm stone within the mid left ureter.

Bilateral nephrolithiasis.
